# Patient Record
Sex: Female | Born: 1980
Health system: Southern US, Community
[De-identification: ages and names within clinical notes are randomized; demographics above are authoritative.]

## PROBLEM LIST (undated history)

## (undated) DIAGNOSIS — F419 Anxiety disorder, unspecified: Secondary | ICD-10-CM

## (undated) DIAGNOSIS — IMO0002 Reserved for concepts with insufficient information to code with codable children: Secondary | ICD-10-CM

## (undated) DIAGNOSIS — C801 Malignant (primary) neoplasm, unspecified: Secondary | ICD-10-CM

## (undated) DIAGNOSIS — K274 Chronic or unspecified peptic ulcer, site unspecified, with hemorrhage: Secondary | ICD-10-CM

## (undated) DIAGNOSIS — Z5189 Encounter for other specified aftercare: Secondary | ICD-10-CM

## (undated) DIAGNOSIS — T7840XA Allergy, unspecified, initial encounter: Secondary | ICD-10-CM

## (undated) HISTORY — DX: Malignant (primary) neoplasm, unspecified: C80.1

## (undated) HISTORY — DX: Anxiety disorder, unspecified: F41.9

## (undated) HISTORY — DX: Encounter for other specified aftercare: Z51.89

## (undated) HISTORY — PX: MELANOMA EXCISION: SHX5266

## (undated) HISTORY — DX: Reserved for concepts with insufficient information to code with codable children: IMO0002

## (undated) HISTORY — DX: Allergy, unspecified, initial encounter: T78.40XA

## (undated) HISTORY — DX: Chronic or unspecified peptic ulcer, site unspecified, with hemorrhage: K27.4

---

## 2004-12-27 ENCOUNTER — Ambulatory Visit: Payer: Self-pay | Admitting: Internal Medicine

## 2006-04-26 IMAGING — CT CT HEAD WITHOUT CONTRAST
1 series · 16 of 29 positions shown, 20 images · non-contrast
Comparison: none

REASON FOR EXAM: New onset headache x 1 week
COMMENTS:

[Series 2: head 5.0 h40s · axial · 0.36mm/px · z∈[+234,+364]mm · 16 of 29 slices shown, 20 images]
[im 2/29  brain]
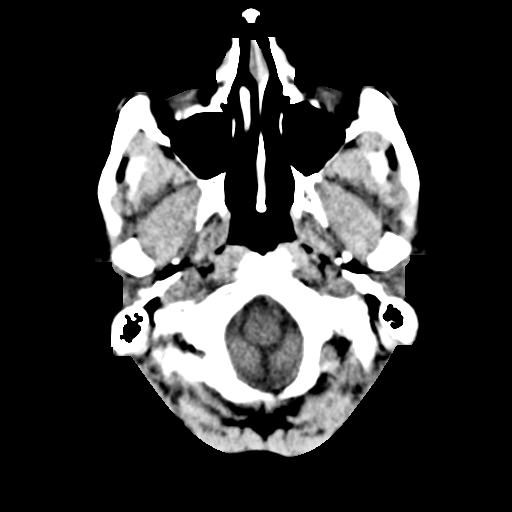
[im 2/29  bone]
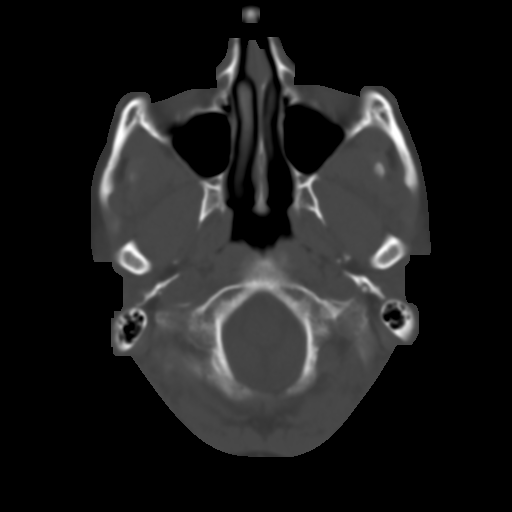
[im 4/29  brain]
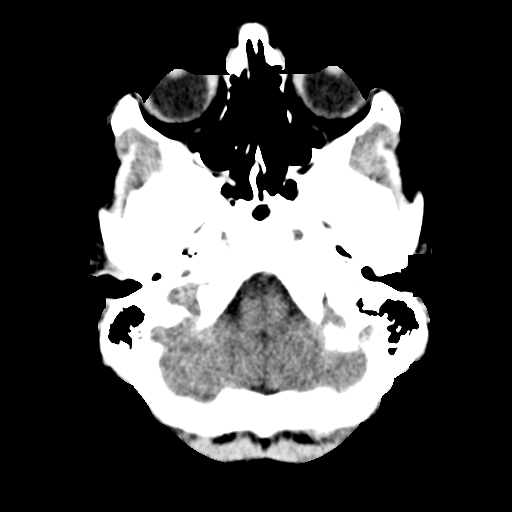
[im 6/29  brain]
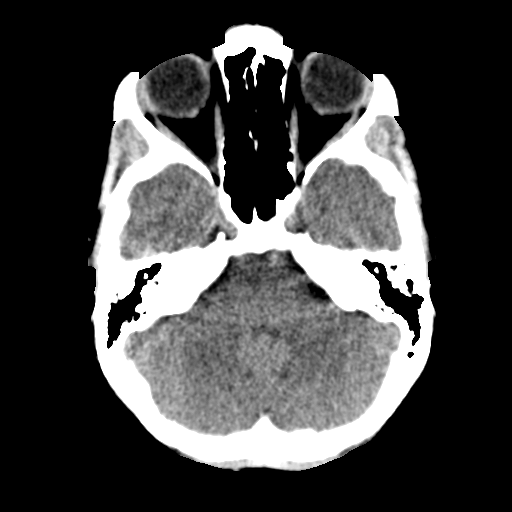
[im 7/29  brain]
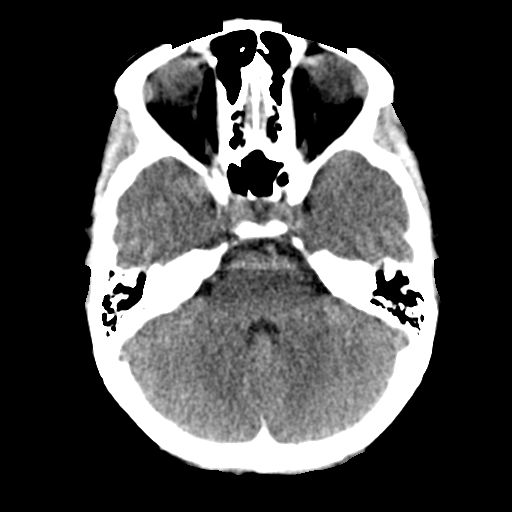
[im 9/29  brain]
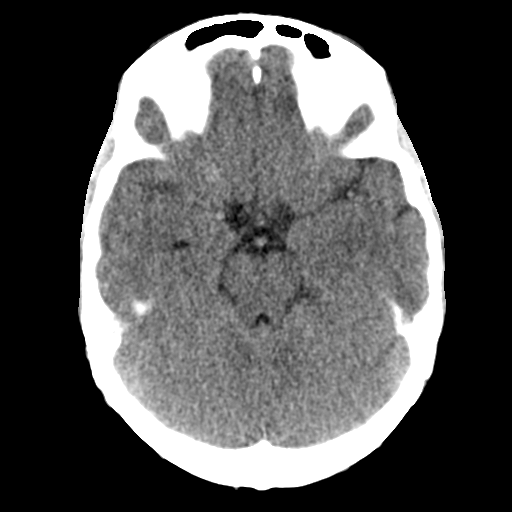
[im 9/29  bone]
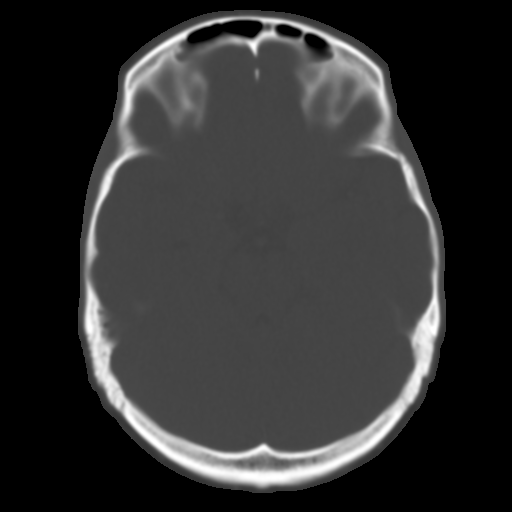
[im 11/29  brain]
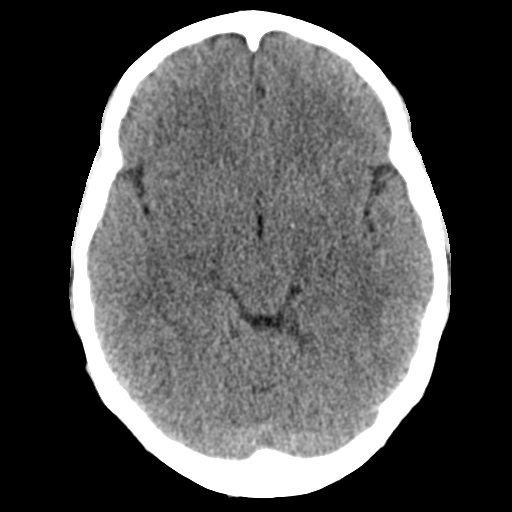
[im 12/29  brain]
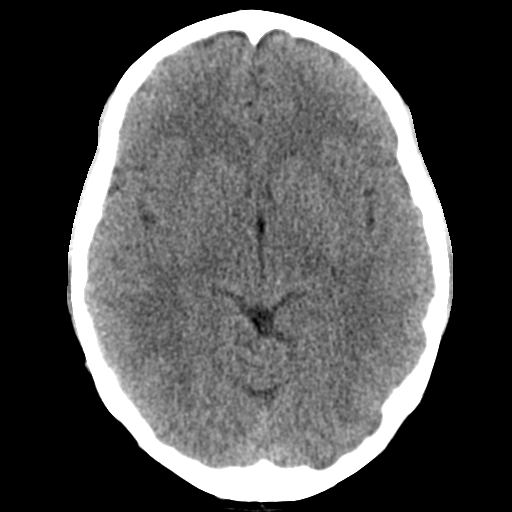
[im 14/29  brain]
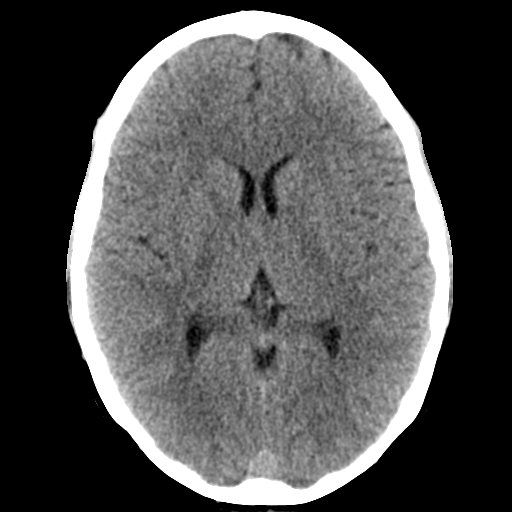
[im 16/29  brain]
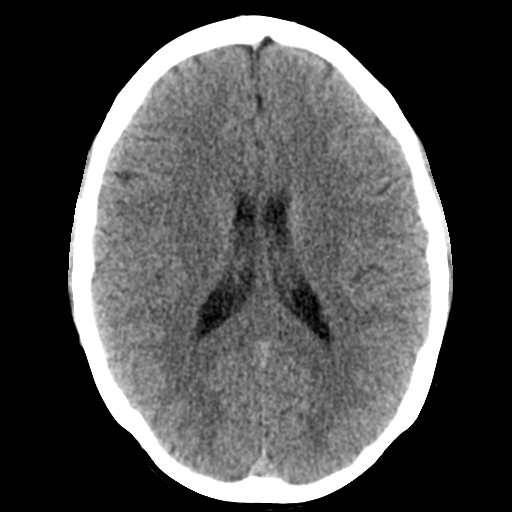
[im 16/29  bone]
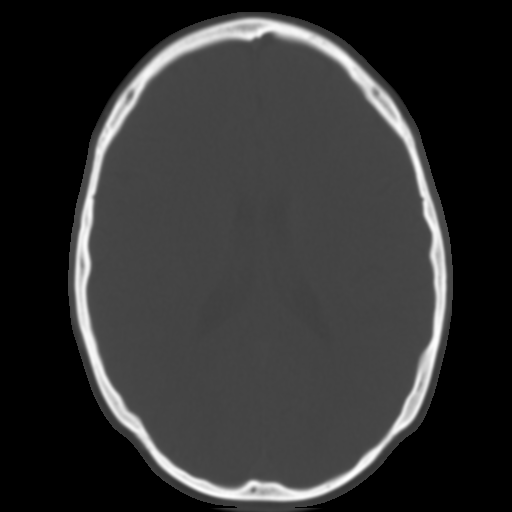
[im 18/29  brain]
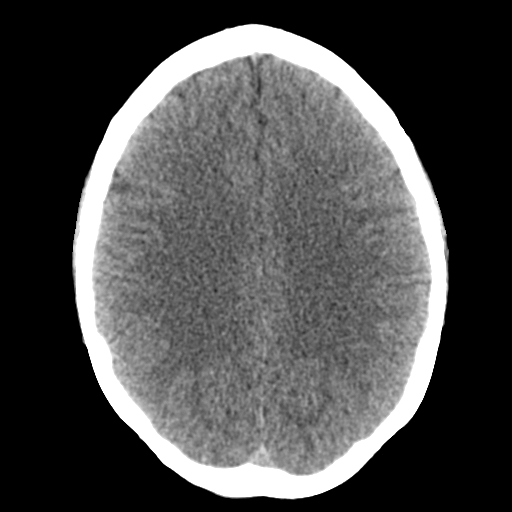
[im 19/29  brain]
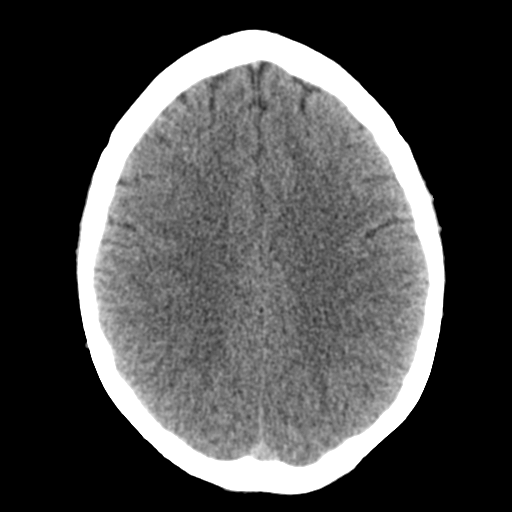
[im 21/29  brain]
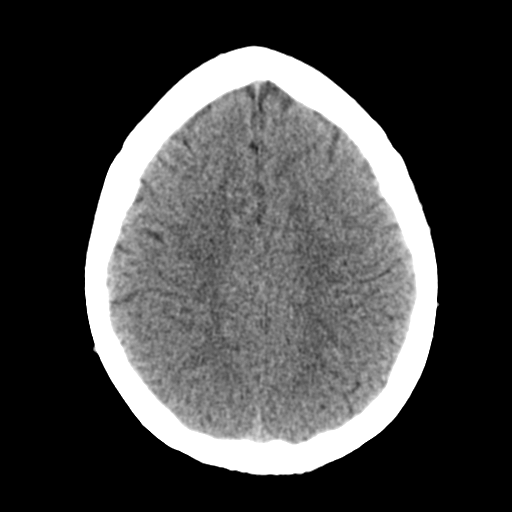
[im 23/29  brain]
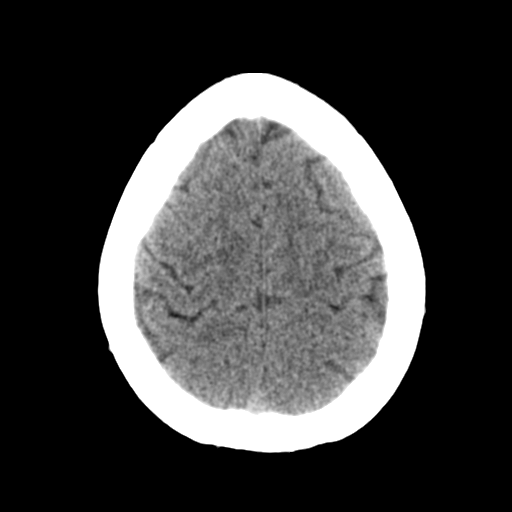
[im 23/29  bone]
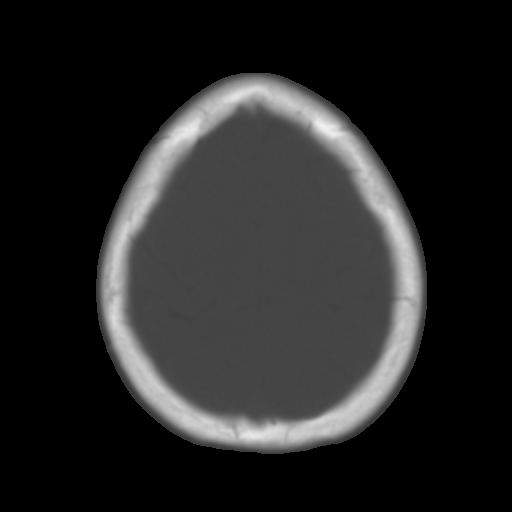
[im 24/29  brain]
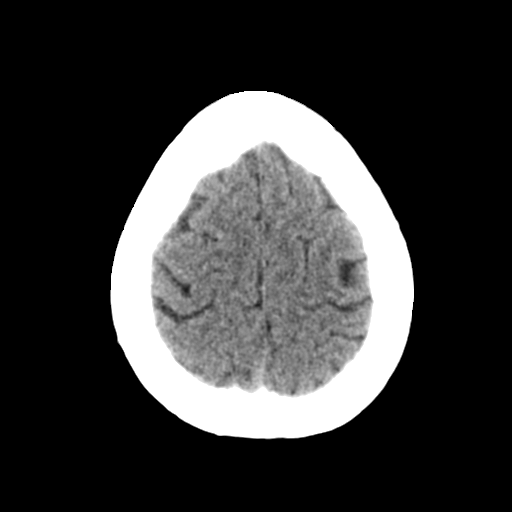
[im 26/29  brain]
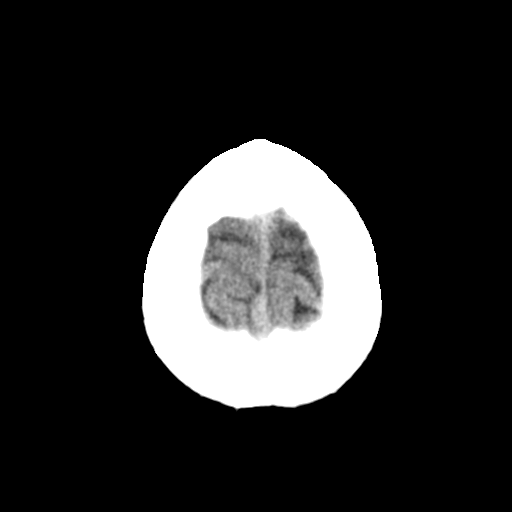
[im 28/29  brain]
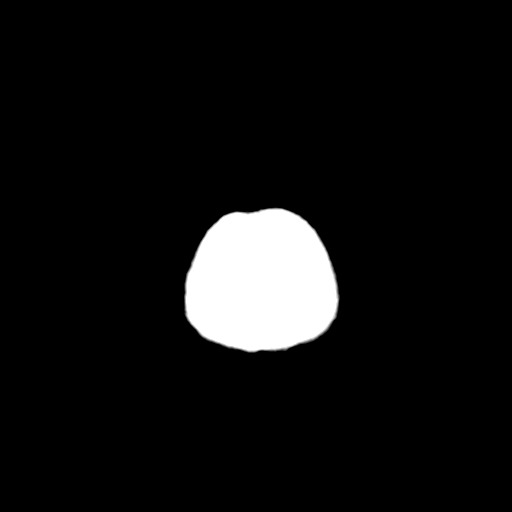

[16 of 29 positions shown; findings below may reference images not displayed]

PROCEDURE:     CT  - CT HEAD WITHOUT CONTRAST  - December 27, 2004  [DATE]

RESULT:     Unenhanced head CT scan was performed for new onset of headaches
which is generalized.

No subarachnoid hemorrhage is seen.  No intraparenchymal bleeds are noted.
No mass effect and no shift of the midline.  Ventricles appear within normal
limits.  No extraaxial fluid collections are noted.  The sinuses appear
clear of the portions that are visualized on the bone window settings.
IMPRESSION: No significant abnormalities noted on the unenhanced head CT.

## 2010-02-13 DIAGNOSIS — K284 Chronic or unspecified gastrojejunal ulcer with hemorrhage: Secondary | ICD-10-CM

## 2010-02-13 DIAGNOSIS — K274 Chronic or unspecified peptic ulcer, site unspecified, with hemorrhage: Secondary | ICD-10-CM

## 2010-02-13 HISTORY — DX: Chronic or unspecified peptic ulcer, site unspecified, with hemorrhage: K27.4

## 2010-02-13 HISTORY — DX: Chronic or unspecified gastrojejunal ulcer with hemorrhage: K28.4

## 2010-03-24 ENCOUNTER — Inpatient Hospital Stay: Payer: Self-pay | Admitting: Internal Medicine

## 2010-03-28 ENCOUNTER — Emergency Department: Payer: Self-pay | Admitting: Emergency Medicine

## 2010-03-29 LAB — PATHOLOGY REPORT

## 2011-12-31 IMAGING — CT CT HEAD WITHOUT CONTRAST
2 series · 16 of 30 positions shown, 20 images · non-contrast
Comparison: none

REASON FOR EXAM: headache
COMMENTS:

PROCEDURE:     CT  - CT HEAD WITHOUT CONTRAST  - March 28, 2010  [DATE]
RESULT:     Comparison:  None
TECHNIQUE: Multiple axial images from the foramen magnum to the vertex were
obtained without IV contrast.

[Series 2: without · axial · non-contrast · 0.37mm/px · z∈[+962,+1082]mm · 13 of 29 slices shown, 17 images]
[im 3/29  brain]
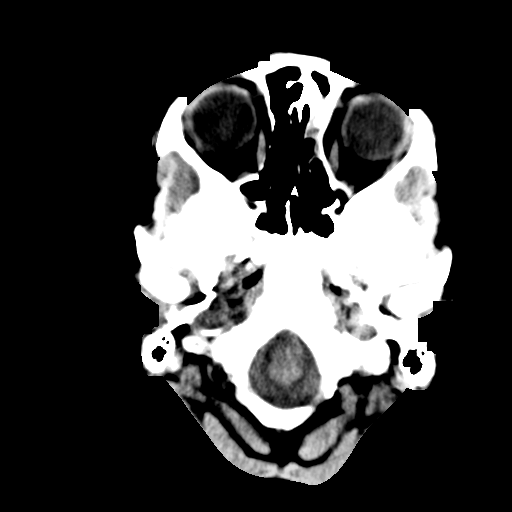
[im 3/29  bone]
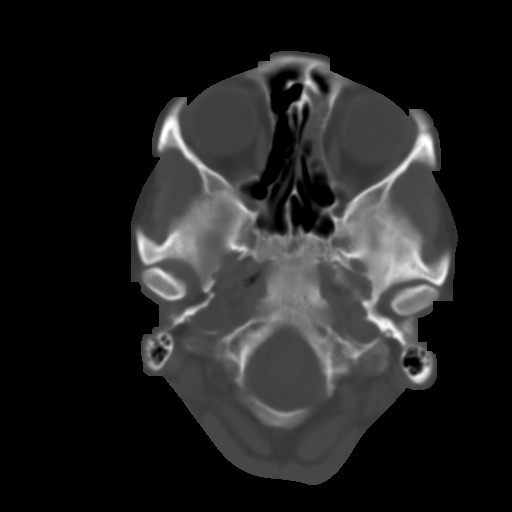
[im 5/29  brain]
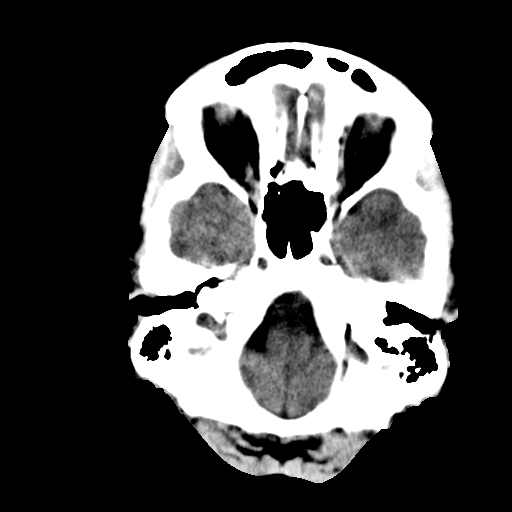
[im 7/29  brain]
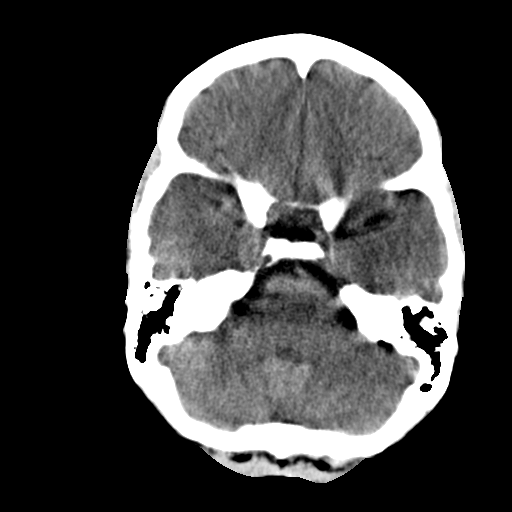
[im 9/29  brain]
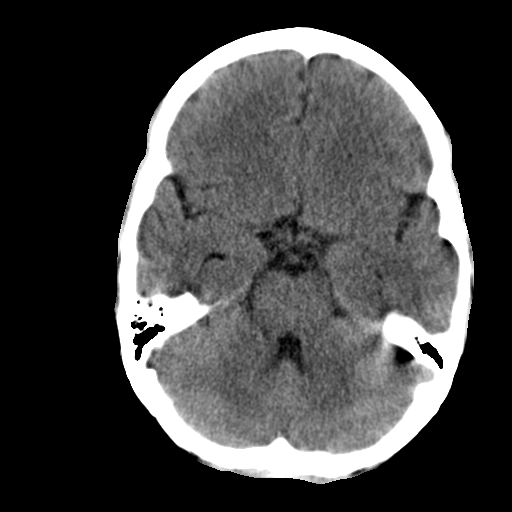
[im 11/29  brain]
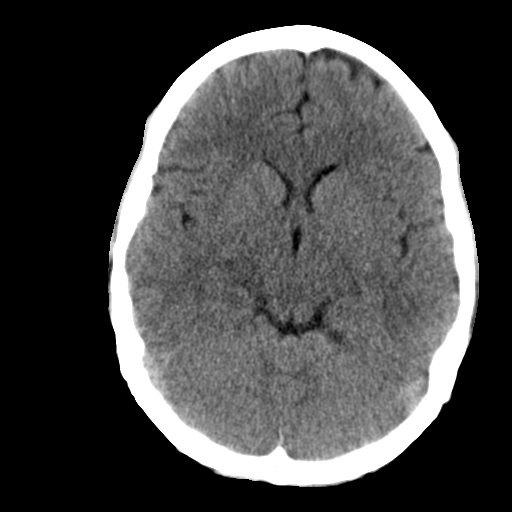
[im 11/29  bone]
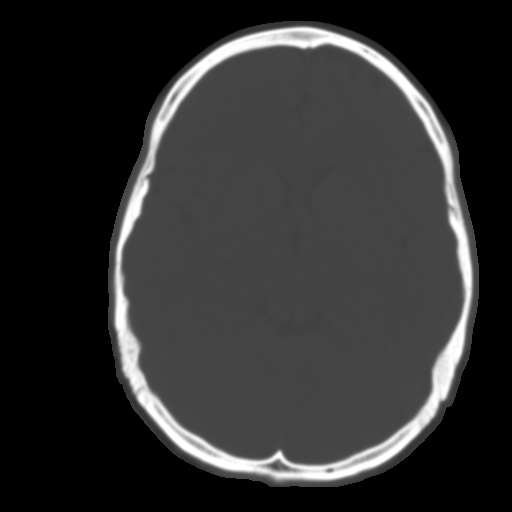
[im 13/29  brain]
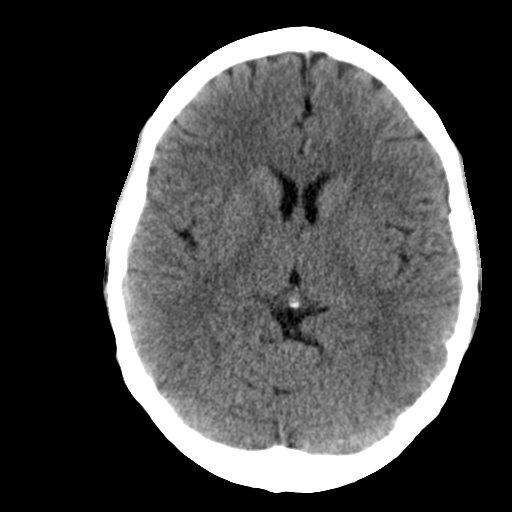
[im 15/29  brain]
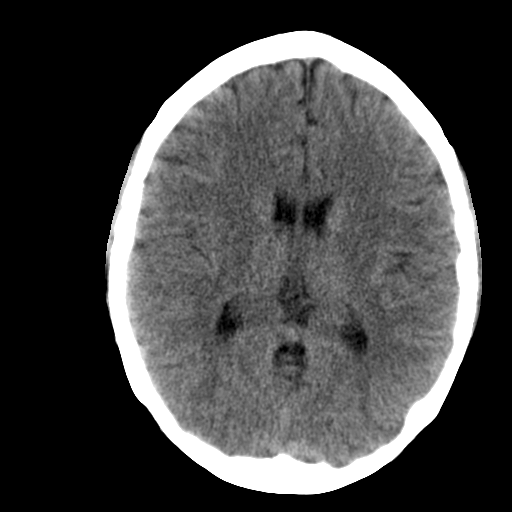
[im 17/29  brain]
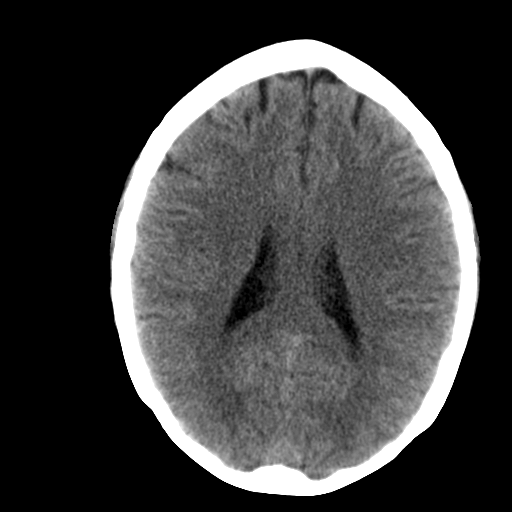
[im 19/29  brain]
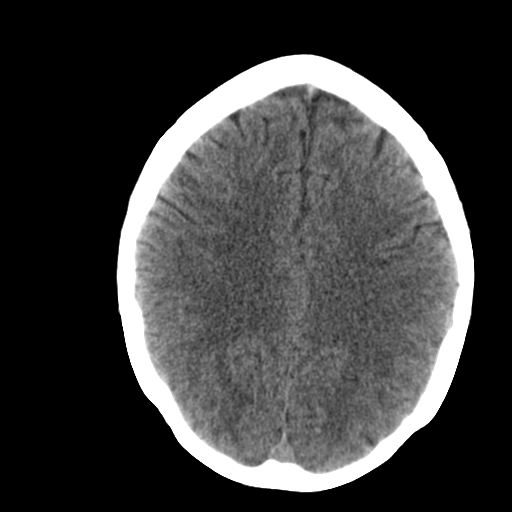
[im 19/29  bone]
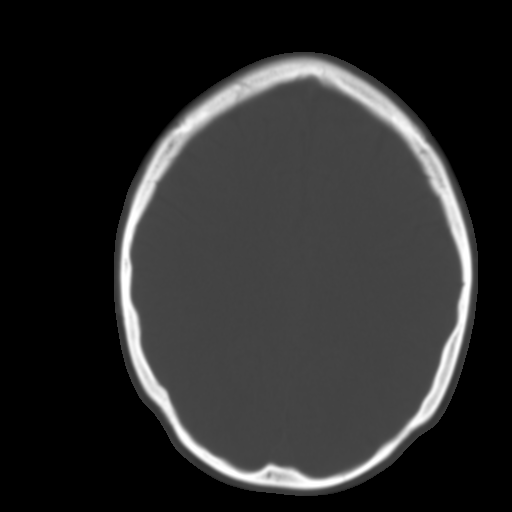
[im 21/29  brain]
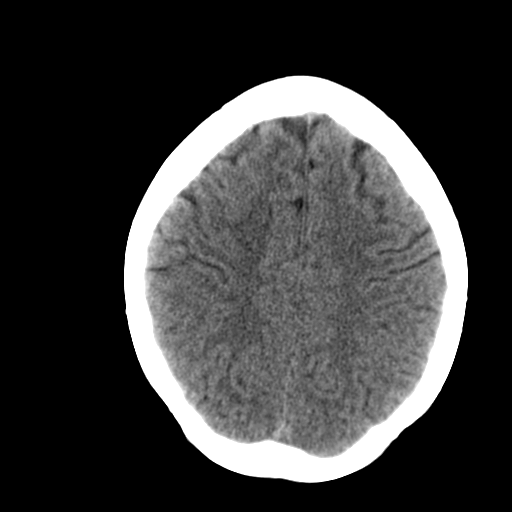
[im 23/29  brain]
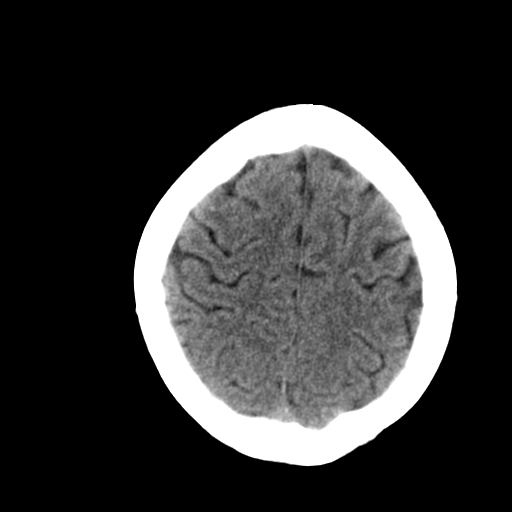
[im 25/29  brain]
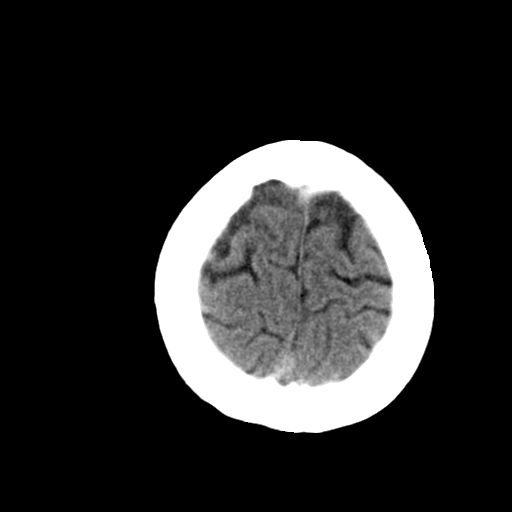
[im 27/29  brain]
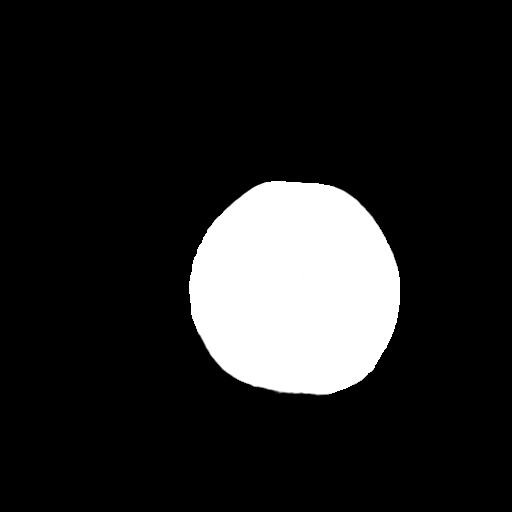
[im 27/29  bone]
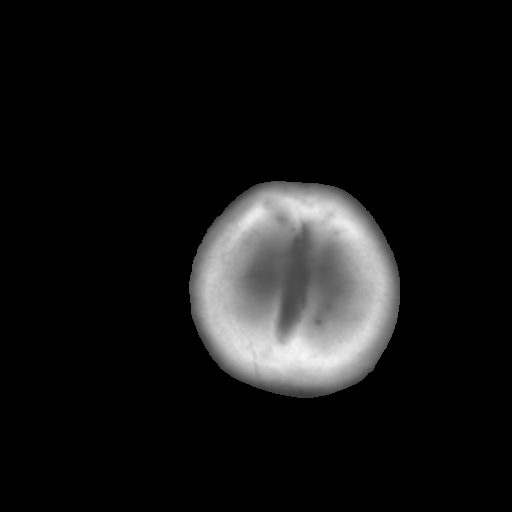

[Series 3: bone · axial · 0.37mm/px · z∈[+962,+1002]mm · 3 of 29 slices shown]
[im 3/29  bone]
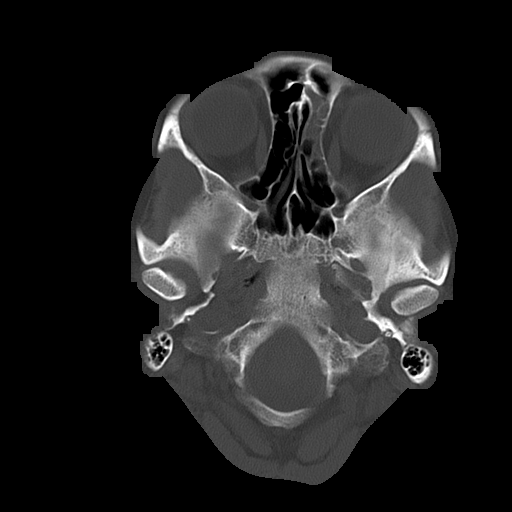
[im 7/29  bone]
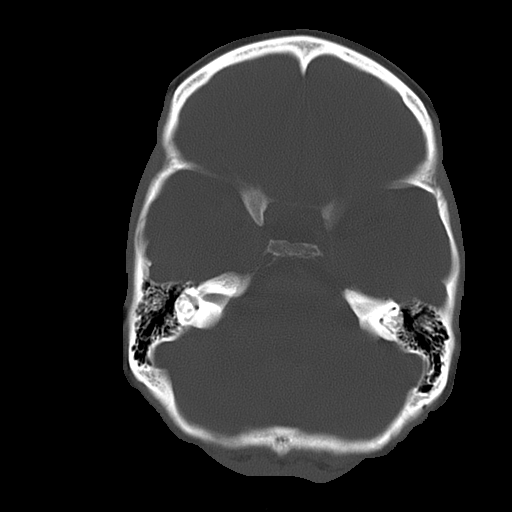
[im 11/29  bone]
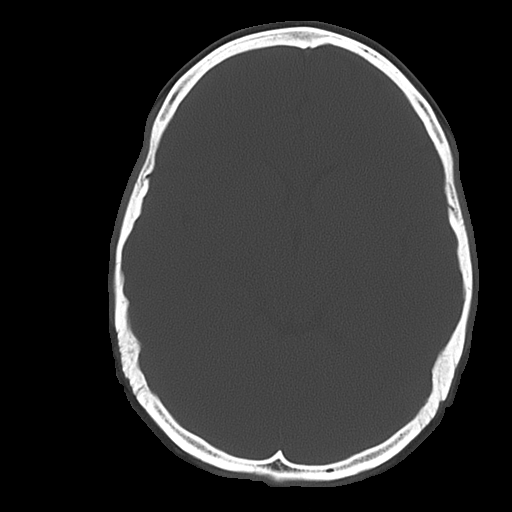

[16 of 30 positions shown; findings below may reference images not displayed]

FINDINGS: There is no evidence of mass effect, midline shift, or extra-axial fluid
collections.  There is no evidence of a space-occupying lesion or
intracranial hemorrhage. There is no evidence of a cortical-based area of
acute infarction.

The ventricles and sulci are appropriate for the patient's age. The basal
cisterns are patent.

Visualized portions of the orbits are unremarkable. There is left ethmoid
sinus mucosal thickening.

The osseous structures are unremarkable.
IMPRESSION: No acute intracranial process.

## 2013-12-26 ENCOUNTER — Observation Stay: Payer: Self-pay | Admitting: Obstetrics and Gynecology

## 2013-12-26 LAB — CBC WITH DIFFERENTIAL/PLATELET
BASOS ABS: 0.1 10*3/uL (ref 0.0–0.1)
Basophil %: 0.8 %
Eosinophil #: 0 10*3/uL (ref 0.0–0.7)
Eosinophil %: 0.4 %
HCT: 43.2 % (ref 35.0–47.0)
HGB: 14.8 g/dL (ref 12.0–16.0)
LYMPHS ABS: 1.5 10*3/uL (ref 1.0–3.6)
Lymphocyte %: 15.3 %
MCH: 32.6 pg (ref 26.0–34.0)
MCHC: 34.4 g/dL (ref 32.0–36.0)
MCV: 95 fL (ref 80–100)
MONOS PCT: 7.6 %
Monocyte #: 0.7 x10 3/mm (ref 0.2–0.9)
NEUTROS PCT: 75.9 %
Neutrophil #: 7.3 10*3/uL — ABNORMAL HIGH (ref 1.4–6.5)
Platelet: 237 10*3/uL (ref 150–440)
RBC: 4.55 10*6/uL (ref 3.80–5.20)
RDW: 13.4 % (ref 11.5–14.5)
WBC: 9.6 10*3/uL (ref 3.6–11.0)

## 2014-01-01 ENCOUNTER — Inpatient Hospital Stay: Payer: Self-pay | Admitting: Obstetrics & Gynecology

## 2014-01-01 LAB — CBC WITH DIFFERENTIAL/PLATELET
BASOS PCT: 0.6 %
Basophil #: 0.1 10*3/uL (ref 0.0–0.1)
EOS ABS: 0 10*3/uL (ref 0.0–0.7)
Eosinophil %: 0.2 %
HCT: 39.3 % (ref 35.0–47.0)
HGB: 13.4 g/dL (ref 12.0–16.0)
Lymphocyte #: 1.1 10*3/uL (ref 1.0–3.6)
Lymphocyte %: 11.4 %
MCH: 32.3 pg (ref 26.0–34.0)
MCHC: 34.1 g/dL (ref 32.0–36.0)
MCV: 95 fL (ref 80–100)
MONOS PCT: 8.1 %
Monocyte #: 0.8 x10 3/mm (ref 0.2–0.9)
NEUTROS PCT: 79.7 %
Neutrophil #: 7.5 10*3/uL — ABNORMAL HIGH (ref 1.4–6.5)
Platelet: 218 10*3/uL (ref 150–440)
RBC: 4.16 10*6/uL (ref 3.80–5.20)
RDW: 13.2 % (ref 11.5–14.5)
WBC: 9.5 10*3/uL (ref 3.6–11.0)

## 2014-01-03 LAB — HEMATOCRIT: HCT: 32 % — AB (ref 35.0–47.0)

## 2014-06-23 NOTE — H&P (Signed)
L&D Evaluation:  History Expanded:  HPI 34yo G2P0 at 23w1day who presents for IOL due to postdates and oligo at 5.7. she is Opos/RI/VI/ GBS pos who has not gone into spontaneous labor. she desires as natural a delivery as possible but understands that she is in a high risk category that requires active management of labor topromote delivery. She got her tdap on 8/28 she has a douls for labor per her notes, she failed her one hour but passed her 3 hour ogtt.   Gravida 2   Term 0   PreTerm 0   Abortion 1   Living 0   Blood Type (Maternal) O positive   Group B Strep Results Maternal (Result >5wks must be treated as unknown) positive   Maternal HIV Negative   Maternal Syphilis Ab Nonreactive   Maternal Varicella Immune   Rubella Results (Maternal) immune   Maternal T-Dap Immune   EDC 18-Dec-2013   Presents with oligo and post dates   Patient's Medical History No Chronic Illness   Patient's Surgical History none   Medications Pre Natal Vitamins   Allergies other, NSAIDS   Social History none   Family History Non-Contributory   ROS:  ROS All systems were reviewed.  HEENT, CNS, GI, GU, Respiratory, CV, Renal and Musculoskeletal systems were found to be normal.   Exam:  Vital Signs stable   Urine Protein not completed   General no apparent distress   Mental Status clear   Chest clear   Heart normal sinus rhythm   Abdomen gravid, non-tender   Estimated Fetal Weight Average for gestational age   Fetal Position vertex   Fundal Height term   Back no CVAT   Edema no edema   Reflexes 1+   Pelvic no external lesions, 1/80/-2   Mebranes Intact   FHT normal rate with no decels   Ucx absent   Skin dry   Lymph no lymphadenopathy   Impression:  Impression oligo postdates   Plan:  Plan EFM/NST, antibiotics for GBBS prophylaxis   Comments nipple stim and foley bulb and allow as natural as possible.pt aware the plan may change intermittently    Electronic Signatures: Erik Obey (MD)  (Signed 13-Nov-15 17:50)  Authored: L&D Evaluation   Last Updated: 13-Nov-15 17:50 by Erik Obey (MD)

## 2016-01-03 ENCOUNTER — Encounter: Payer: Self-pay | Admitting: Family Medicine

## 2016-01-03 ENCOUNTER — Ambulatory Visit (INDEPENDENT_AMBULATORY_CARE_PROVIDER_SITE_OTHER): Payer: PRIVATE HEALTH INSURANCE | Admitting: Family Medicine

## 2016-01-03 VITALS — BP 104/71 | HR 85 | Temp 98.3°F | Ht 65.1 in | Wt 115.0 lb

## 2016-01-03 DIAGNOSIS — Z8711 Personal history of peptic ulcer disease: Secondary | ICD-10-CM | POA: Insufficient documentation

## 2016-01-03 DIAGNOSIS — Z8719 Personal history of other diseases of the digestive system: Secondary | ICD-10-CM

## 2016-01-03 NOTE — Progress Notes (Signed)
BP 104/71 (BP Location: Left Arm, Patient Position: Sitting, Cuff Size: Normal)   Pulse 85   Temp 98.3 F (36.8 C)   Ht 5' 5.1" (1.654 m)   Wt 115 lb (52.2 kg)   LMP 12/11/2015 (Exact Date)   SpO2 100%   BMI 19.08 kg/m    Subjective:    Patient ID: Debbie Green, female    DOB: 23-Jan-1981, 35 y.o.   MRN: [id number]  HPI: Debbie Green is a 35 y.o. female  Chief Complaint  Patient presents with  . Establish Care   Had some bleeding ulcers in 2012 after taking antibiotics. Ended up needed transfusion.  A couple of months ago felt like she was getting worse again. Had been taking advil again due to headaches. Started feeling like she was nauseous and feeling like she was sick again. Was tested for H. Pylori in the past. She was negative. More of a nagging pain. Drank a ton of cabbage juice and has been feeling better since then. Has been doing healing touch therapy and has been feeling well. Has been under a lot of stress with her job and the healing touch has been helping with this.   Active Ambulatory Problems    Diagnosis Date Noted  . History of gastric ulcer 01/03/2016   Resolved Ambulatory Problems    Diagnosis Date Noted  . No Resolved Ambulatory Problems   Past Medical History:  Diagnosis Date  . Bleeding ulcer 2012   History reviewed. No pertinent surgical history. No medication comments found. No Known Allergies Social History   Social History  . Marital status: Married    Spouse name: N/A  . Number of children: N/A  . Years of education: N/A   Social History Main Topics  . Smoking status: Never Smoker  . Smokeless tobacco: Never Used  . Alcohol use 4.2 oz/week    7 Glasses of wine per week  . Drug use: No  . Sexual activity: Yes    Birth control/ protection: IUD   Other Topics Concern  . None   Social History Narrative  . None   Family History  Problem Relation Age of Onset  . Anemia Mother   . Heart disease Maternal Grandmother    . Aneurysm Paternal Grandmother     Brain  . COPD Paternal Grandfather    Review of Systems  Constitutional: Negative.   Respiratory: Negative.   Cardiovascular: Negative.   Gastrointestinal: Positive for abdominal pain. Negative for abdominal distention, anal bleeding, blood in stool, constipation, diarrhea, nausea, rectal pain and vomiting.  Psychiatric/Behavioral: Negative.     Per HPI unless specifically indicated above     Objective:    BP 104/71 (BP Location: Left Arm, Patient Position: Sitting, Cuff Size: Normal)   Pulse 85   Temp 98.3 F (36.8 C)   Ht 5' 5.1" (1.654 m)   Wt 115 lb (52.2 kg)   LMP 12/11/2015 (Exact Date)   SpO2 100%   BMI 19.08 kg/m   Wt Readings from Last 3 Encounters:  01/03/16 115 lb (52.2 kg)    Physical Exam  Constitutional: She is oriented to person, place, and time. She appears well-developed and well-nourished. No distress.  HENT:  Head: Normocephalic and atraumatic.  Right Ear: Hearing normal.  Left Ear: Hearing normal.  Nose: Nose normal.  Eyes: Conjunctivae and lids are normal. Right eye exhibits no discharge. Left eye exhibits no discharge. No scleral icterus.  Cardiovascular: Normal rate, regular rhythm, normal heart  sounds and intact distal pulses.  Exam reveals no gallop and no friction rub.   No murmur heard. Pulmonary/Chest: Effort normal and breath sounds normal. No respiratory distress. She has no wheezes. She has no rales. She exhibits no tenderness.  Abdominal: Soft. Bowel sounds are normal. She exhibits no distension and no mass. There is no tenderness. There is no rebound and no guarding.  Musculoskeletal: Normal range of motion.  Neurological: She is alert and oriented to person, place, and time.  Skin: Skin is intact. No rash noted.  Psychiatric: She has a normal mood and affect. Her speech is normal and behavior is normal. Judgment and thought content normal. Cognition and memory are normal.  Nursing note and vitals  reviewed.      Assessment & Plan:   Problem List Items Addressed This Visit      Other   History of gastric ulcer - Primary    No big symptoms today, but had some pain about a month ago. Will check labs. Call with any pain. Follow up after new year for physical.      Relevant Orders   CBC with Differential/Platelet   Comprehensive metabolic panel   Lipid Panel w/o Chol/HDL Ratio   TSH   H. pylori antibody, IgG       Follow up plan: Return After the new year, for Physical.

## 2016-01-03 NOTE — Assessment & Plan Note (Signed)
No big symptoms today, but had some pain about a month ago. Will check labs. Call with any pain. Follow up after new year for physical.

## 2016-01-04 LAB — COMPREHENSIVE METABOLIC PANEL
ALT: 10 IU/L (ref 0–32)
AST: 17 IU/L (ref 0–40)
Albumin/Globulin Ratio: 2 (ref 1.2–2.2)
Albumin: 4.8 g/dL (ref 3.5–5.5)
Alkaline Phosphatase: 74 IU/L (ref 39–117)
BILIRUBIN TOTAL: 0.6 mg/dL (ref 0.0–1.2)
BUN/Creatinine Ratio: 19 (ref 9–23)
BUN: 15 mg/dL (ref 6–20)
CHLORIDE: 100 mmol/L (ref 96–106)
CO2: 24 mmol/L (ref 18–29)
Calcium: 9.4 mg/dL (ref 8.7–10.2)
Creatinine, Ser: 0.8 mg/dL (ref 0.57–1.00)
GFR calc non Af Amer: 96 mL/min/{1.73_m2} (ref >59)
GFR, EST AFRICAN AMERICAN: 111 mL/min/{1.73_m2} (ref >59)
GLUCOSE: 85 mg/dL (ref 65–99)
Globulin, Total: 2.4 g/dL (ref 1.5–4.5)
Potassium: 4 mmol/L (ref 3.5–5.2)
Sodium: 139 mmol/L (ref 134–144)
TOTAL PROTEIN: 7.2 g/dL (ref 6.0–8.5)

## 2016-01-04 LAB — CBC WITH DIFFERENTIAL/PLATELET
Basophils Absolute: 0.1 10*3/uL (ref 0.0–0.2)
Basos: 1 %
EOS (ABSOLUTE): 0.1 10*3/uL (ref 0.0–0.4)
Eos: 1 %
Hematocrit: 41.7 % (ref 34.0–46.6)
Hemoglobin: 13.9 g/dL (ref 11.1–15.9)
IMMATURE GRANS (ABS): 0 10*3/uL (ref 0.0–0.1)
IMMATURE GRANULOCYTES: 0 %
LYMPHS: 33 %
Lymphocytes Absolute: 1.9 10*3/uL (ref 0.7–3.1)
MCH: 29.4 pg (ref 26.6–33.0)
MCHC: 33.3 g/dL (ref 31.5–35.7)
MCV: 88 fL (ref 79–97)
Monocytes Absolute: 0.5 10*3/uL (ref 0.1–0.9)
Monocytes: 8 %
Neutrophils Absolute: 3.2 10*3/uL (ref 1.4–7.0)
Neutrophils: 57 %
Platelets: 282 10*3/uL (ref 150–379)
RBC: 4.73 x10E6/uL (ref 3.77–5.28)
RDW: 13.1 % (ref 12.3–15.4)
WBC: 5.7 10*3/uL (ref 3.4–10.8)

## 2016-01-04 LAB — LIPID PANEL W/O CHOL/HDL RATIO
Cholesterol, Total: 198 mg/dL (ref 100–199)
HDL: 84 mg/dL (ref >39)
LDL Calculated: 105 mg/dL — ABNORMAL HIGH (ref 0–99)
TRIGLYCERIDES: 45 mg/dL (ref 0–149)
VLDL CHOLESTEROL CAL: 9 mg/dL (ref 5–40)

## 2016-01-04 LAB — H. PYLORI ANTIBODY, IGG: H Pylori IgG: <0.9 U/mL (ref 0.0–0.8)

## 2016-01-04 LAB — TSH: TSH: 0.833 u[IU]/mL (ref 0.450–4.500)

## 2016-02-22 ENCOUNTER — Ambulatory Visit (INDEPENDENT_AMBULATORY_CARE_PROVIDER_SITE_OTHER): Payer: PRIVATE HEALTH INSURANCE | Admitting: Family Medicine

## 2016-02-22 ENCOUNTER — Encounter: Payer: Self-pay | Admitting: Family Medicine

## 2016-02-22 VITALS — BP 124/81 | HR 84 | Temp 98.2°F | Ht 65.0 in | Wt 115.8 lb

## 2016-02-22 DIAGNOSIS — Z0000 Encounter for general adult medical examination without abnormal findings: Secondary | ICD-10-CM

## 2016-02-22 DIAGNOSIS — Z23 Encounter for immunization: Secondary | ICD-10-CM

## 2016-02-22 LAB — UA/M W/RFLX CULTURE, ROUTINE
BILIRUBIN UA: NEGATIVE
Glucose, UA: NEGATIVE
Ketones, UA: NEGATIVE
Leukocytes, UA: NEGATIVE
NITRITE UA: NEGATIVE
PH UA: 7.5 (ref 5.0–7.5)
Protein, UA: NEGATIVE
RBC UA: NEGATIVE
SPEC GRAV UA: 1.01 (ref 1.005–1.030)
Urobilinogen, Ur: 0.2 mg/dL (ref 0.2–1.0)

## 2016-02-22 NOTE — Progress Notes (Signed)
BP 124/81 (BP Location: Left Arm, Patient Position: Sitting, Cuff Size: Normal)   Pulse 84   Temp 98.2 F (36.8 C)   Ht 5\' 5"  (1.651 m)   Wt 115 lb 12.8 oz (52.5 kg)   LMP 01/23/2016 (Approximate)   SpO2 100%   BMI 19.27 kg/m    Subjective:    Patient ID: Debbie Green, female    DOB: 1980/05/21, 36 y.o.   MRN: [id number]  HPI: FLO PIGNOTTI is a 36 y.o. female presenting on 02/22/2016 for comprehensive medical examination. Current medical complaints include: Has been under a lot more stress since last time. Has been doing healing touch.   She currently lives with: husband and kid Menopausal Symptoms: no  Depression Screen done today and results listed below:  Depression screen PHQ 2/9 02/22/2016  Decreased Interest 0  Down, Depressed, Hopeless 0  PHQ - 2 Score 0    Past Medical History:  Past Medical History:  Diagnosis Date  . Bleeding ulcer 2012   Surgical History:  History reviewed. No pertinent surgical history.  Medications:  Current Outpatient Prescriptions on File Prior to Visit  Medication Sig  . HESPERIDIN-DIOSMIN PO Take by mouth.   No current facility-administered medications on file prior to visit.     Allergies:  No Known Allergies  Social History:  Social History   Social History  . Marital status: Married    Spouse name: N/A  . Number of children: N/A  . Years of education: N/A   Occupational History  . Not on file.   Social History Main Topics  . Smoking status: Never Smoker  . Smokeless tobacco: Never Used  . Alcohol use 4.2 oz/week    7 Glasses of wine per week  . Drug use: No  . Sexual activity: Yes    Birth control/ protection: IUD   Other Topics Concern  . Not on file   Social History Narrative  . No narrative on file   History  Smoking Status  . Never Smoker  Smokeless Tobacco  . Never Used   History  Alcohol Use  . 4.2 oz/week  . 7 Glasses of wine per week    Family History:  Family History    Problem Relation Age of Onset  . Anemia Mother   . Heart disease Maternal Grandmother   . Aneurysm Paternal Grandmother     Brain  . COPD Paternal Grandfather    Past medical history, surgical history, medications, allergies, family history and social history reviewed with patient today and changes made to appropriate areas of the chart.   Review of Systems  Constitutional: Negative.   HENT: Negative.   Eyes: Negative.   Respiratory: Negative.   Cardiovascular: Negative.   Gastrointestinal: Positive for abdominal pain and heartburn. Negative for blood in stool, constipation, diarrhea, melena, nausea and vomiting.  Genitourinary: Negative.   Musculoskeletal: Positive for back pain. Negative for falls, joint pain, myalgias and neck pain.       Breast tenderness   Skin: Positive for rash. Negative for itching.  Neurological: Negative.   Endo/Heme/Allergies: Negative for environmental allergies and polydipsia. Bruises/bleeds easily.  Psychiatric/Behavioral: Negative for depression, hallucinations, memory loss, substance abuse and suicidal ideas. The patient is nervous/anxious. The patient does not have insomnia.     All other ROS negative except what is listed above and in the HPI.      Objective:    BP 124/81 (BP Location: Left Arm, Patient Position: Sitting, Cuff Size: Normal)  Pulse 84   Temp 98.2 F (36.8 C)   Ht 5\' 5"  (1.651 m)   Wt 115 lb 12.8 oz (52.5 kg)   LMP 01/23/2016 (Approximate)   SpO2 100%   BMI 19.27 kg/m   Wt Readings from Last 3 Encounters:  02/22/16 115 lb 12.8 oz (52.5 kg)  01/03/16 115 lb (52.2 kg)    Physical Exam  Constitutional: She is oriented to person, place, and time. She appears well-developed and well-nourished. No distress.  HENT:  Head: Normocephalic and atraumatic.  Right Ear: Hearing, tympanic membrane, external ear and ear canal normal.  Left Ear: Hearing, tympanic membrane, external ear and ear canal normal.  Nose: Nose normal.   Mouth/Throat: Uvula is midline, oropharynx is clear and moist and mucous membranes are normal. No oropharyngeal exudate.  Eyes: Conjunctivae, EOM and lids are normal. Right eye exhibits no discharge. Left eye exhibits no discharge. No scleral icterus.  Neck: Normal range of motion. Neck supple. No JVD present. No tracheal deviation present. No thyromegaly present.  Cardiovascular: Normal rate, regular rhythm, normal heart sounds and intact distal pulses.  Exam reveals no gallop and no friction rub.   No murmur heard. Pulmonary/Chest: Effort normal and breath sounds normal. No stridor. No respiratory distress. She has no wheezes. She has no rales. She exhibits no tenderness.  Abdominal: Soft. Bowel sounds are normal. She exhibits no distension and no mass. There is no tenderness. There is no rebound and no guarding.  Genitourinary:  Genitourinary Comments: Breast and Pelvic deferred- done at GYN  Musculoskeletal: Normal range of motion. She exhibits no edema, tenderness or deformity.  Lymphadenopathy:    She has no cervical adenopathy.  Neurological: She is alert and oriented to person, place, and time. She has normal reflexes. She displays normal reflexes. No cranial nerve deficit. She exhibits normal muscle tone. Coordination normal.  Skin: Skin is warm, dry and intact. No rash noted. She is not diaphoretic. No erythema. No pallor.  Psychiatric: She has a normal mood and affect. Her speech is normal and behavior is normal. Judgment and thought content normal. Cognition and memory are normal.  Nursing note and vitals reviewed.   Results for orders placed or performed in visit on 01/03/16  CBC with Differential/Platelet  Result Value Ref Range   WBC 5.7 3.4 - 10.8 x10E3/uL   RBC 4.73 3.77 - 5.28 x10E6/uL   Hemoglobin 13.9 11.1 - 15.9 g/dL   Hematocrit 41.7 34.0 - 46.6 %   MCV 88 79 - 97 fL   MCH 29.4 26.6 - 33.0 pg   MCHC 33.3 31.5 - 35.7 g/dL   RDW 13.1 12.3 - 15.4 %   Platelets 282  150 - 379 x10E3/uL   Neutrophils 57 Not Estab. %   Lymphs 33 Not Estab. %   Monocytes 8 Not Estab. %   Eos 1 Not Estab. %   Basos 1 Not Estab. %   Neutrophils Absolute 3.2 1.4 - 7.0 x10E3/uL   Lymphocytes Absolute 1.9 0.7 - 3.1 x10E3/uL   Monocytes Absolute 0.5 0.1 - 0.9 x10E3/uL   EOS (ABSOLUTE) 0.1 0.0 - 0.4 x10E3/uL   Basophils Absolute 0.1 0.0 - 0.2 x10E3/uL   Immature Granulocytes 0 Not Estab. %   Immature Grans (Abs) 0.0 0.0 - 0.1 x10E3/uL  Comprehensive metabolic panel  Result Value Ref Range   Glucose 85 65 - 99 mg/dL   BUN 15 6 - 20 mg/dL   Creatinine, Ser 0.80 0.57 - 1.00 mg/dL   GFR calc  non Af Amer 96 >59 mL/min/1.73   GFR calc Af Amer 111 >59 mL/min/1.73   BUN/Creatinine Ratio 19 9 - 23   Sodium 139 134 - 144 mmol/L   Potassium 4.0 3.5 - 5.2 mmol/L   Chloride 100 96 - 106 mmol/L   CO2 24 18 - 29 mmol/L   Calcium 9.4 8.7 - 10.2 mg/dL   Total Protein 7.2 6.0 - 8.5 g/dL   Albumin 4.8 3.5 - 5.5 g/dL   Globulin, Total 2.4 1.5 - 4.5 g/dL   Albumin/Globulin Ratio 2.0 1.2 - 2.2   Bilirubin Total 0.6 0.0 - 1.2 mg/dL   Alkaline Phosphatase 74 39 - 117 IU/L   AST 17 0 - 40 IU/L   ALT 10 0 - 32 IU/L  Lipid Panel w/o Chol/HDL Ratio  Result Value Ref Range   Cholesterol, Total 198 100 - 199 mg/dL   Triglycerides 45 0 - 149 mg/dL   HDL 84 >39 mg/dL   VLDL Cholesterol Cal 9 5 - 40 mg/dL   LDL Calculated 105 (H) 0 - 99 mg/dL  TSH  Result Value Ref Range   TSH 0.833 0.450 - 4.500 uIU/mL  H. pylori antibody, IgG  Result Value Ref Range   H Pylori IgG <0.9 0.0 - 0.8 U/mL      Assessment & Plan:   Problem List Items Addressed This Visit    None    Visit Diagnoses    Routine general medical examination at a health care facility    -  Primary   Up to date on vaccines. Screening labs checked last visit. Pap to be done at GYN. Continue diet and exercise.    Relevant Orders   UA/M w/rflx Culture, Routine   Immunization due       Flu shot given today.   Relevant Orders    Flu Vaccine QUAD 36+ mos PF IM (Fluarix & Fluzone Quad PF) (Completed)       Follow up plan: Return in about 1 year (around 02/21/2017) for Physical.   LABORATORY TESTING:  - Pap smear: done elsewhere  - Tdap: Tetanus vaccination status reviewed: last tetanus booster within 10 years. - Influenza: Administered today - Pneumovax: Not applicable  PATIENT COUNSELING:   Advised to take 1 mg of folate supplement per day if capable of pregnancy.   Sexuality: Discussed sexually transmitted diseases, partner selection, use of condoms, avoidance of unintended pregnancy  and contraceptive alternatives.   Advised to avoid cigarette smoking.  I discussed with the patient that most people either abstain from alcohol or drink within safe limits (<=14/week and <=4 drinks/occasion for males, <=7/weeks and <= 3 drinks/occasion for females) and that the risk for alcohol disorders and other health effects rises proportionally with the number of drinks per week and how often a drinker exceeds daily limits.  Discussed cessation/primary prevention of drug use and availability of treatment for abuse.   Diet: Encouraged to adjust caloric intake to maintain  or achieve ideal body weight, to reduce intake of dietary saturated fat and total fat, to limit sodium intake by avoiding high sodium foods and not adding table salt, and to maintain adequate dietary potassium and calcium preferably from fresh fruits, vegetables, and low-fat dairy products.    stressed the importance of regular exercise  Injury prevention: Discussed safety belts, safety helmets, smoke detector, smoking near bedding or upholstery.   Dental health: Discussed importance of regular tooth brushing, flossing, and dental visits.    NEXT PREVENTATIVE PHYSICAL DUE IN  1 YEAR. Return in about 1 year (around 02/21/2017) for Physical.

## 2016-02-22 NOTE — Patient Instructions (Addendum)
Influenza (Flu) Vaccine (Inactivated or Recombinant): What You Need to Know 1. Why get vaccinated? Influenza ("flu") is a contagious disease that spreads around the [place] every year, usually between October and May. Flu is caused by influenza viruses, and is spread mainly by coughing, sneezing, and close contact. Anyone can get flu. Flu strikes suddenly and can last several days. Symptoms vary by age, but can include:  fever/chills  sore throat  muscle aches  fatigue  cough  headache  runny or stuffy nose Flu can also lead to pneumonia and blood infections, and cause diarrhea and seizures in children. If you have a medical condition, such as heart or lung disease, flu can make it worse. Flu is more dangerous for some people. Infants and young children, people 65 years of age and older, pregnant women, and people with certain health conditions or a weakened immune system are at greatest risk. Each year thousands of people in the [place] die from flu, and many more are hospitalized. Flu vaccine can:  keep you from getting flu,  make flu less severe if you do get it, and  keep you from spreading flu to your family and other people. 2. Inactivated and recombinant flu vaccines A dose of flu vaccine is recommended every flu season. Children 6 months through 8 years of age may need two doses during the same flu season. Everyone else needs only one dose each flu season. Some inactivated flu vaccines contain a very small amount of a mercury-based preservative called thimerosal. Studies have not shown thimerosal in vaccines to be harmful, but flu vaccines that do not contain thimerosal are available. There is no live flu virus in flu shots. They cannot cause the flu. There are many flu viruses, and they are always changing. Each year a new flu vaccine is made to protect against three or four viruses that are likely to cause disease in the upcoming flu season. But even when the  vaccine doesn't exactly match these viruses, it may still provide some protection. Flu vaccine cannot prevent:  flu that is caused by a virus not covered by the vaccine, or  illnesses that look like flu but are not. It takes about 2 weeks for protection to develop after vaccination, and protection lasts through the flu season. 3. Some people should not get this vaccine Tell the person who is giving you the vaccine:  If you have any severe, life-threatening allergies. If you ever had a life-threatening allergic reaction after a dose of flu vaccine, or have a severe allergy to any part of this vaccine, you may be advised not to get vaccinated. Most, but not all, types of flu vaccine contain a small amount of egg protein.  If you ever had Guillain-Barr Syndrome (also called GBS). Some people with a history of GBS should not get this vaccine. This should be discussed with your doctor.  If you are not feeling well. It is usually okay to get flu vaccine when you have a mild illness, but you might be asked to come back when you feel better. 4. Risks of a vaccine reaction With any medicine, including vaccines, there is a chance of reactions. These are usually mild and go away on their own, but serious reactions are also possible. Most people who get a flu shot do not have any problems with it. Minor problems following a flu shot include:  soreness, redness, or swelling where the shot was given  hoarseness  sore, red or itchy   eyes  cough  fever  aches  headache  itching  fatigue If these problems occur, they usually begin soon after the shot and last 1 or 2 days. More serious problems following a flu shot can include the following:  There may be a small increased risk of Guillain-Barre Syndrome (GBS) after inactivated flu vaccine. This risk has been estimated at 1 or 2 additional cases per million people vaccinated. This is much lower than the risk of severe complications from flu,  which can be prevented by flu vaccine.  Young children who get the flu shot along with pneumococcal vaccine (PCV13) and/or DTaP vaccine at the same time might be slightly more likely to have a seizure caused by fever. Ask your doctor for more information. Tell your doctor if a child who is getting flu vaccine has ever had a seizure. Problems that could happen after any injected vaccine:  People sometimes faint after a medical procedure, including vaccination. Sitting or lying down for about 15 minutes can help prevent fainting, and injuries caused by a fall. Tell your doctor if you feel dizzy, or have vision changes or ringing in the ears.  Some people get severe pain in the shoulder and have difficulty moving the arm where a shot was given. This happens very rarely.  Any medication can cause a severe allergic reaction. Such reactions from a vaccine are very rare, estimated at about 1 in a million doses, and would happen within a few minutes to a few hours after the vaccination. As with any medicine, there is a very remote chance of a vaccine causing a serious injury or death. The safety of vaccines is always being monitored. For more information, visit: www.cdc.gov/vaccinesafety/ 5. What if there is a serious reaction? What should I look for? Look for anything that concerns you, such as signs of a severe allergic reaction, very high fever, or unusual behavior. Signs of a severe allergic reaction can include hives, swelling of the face and throat, difficulty breathing, a fast heartbeat, dizziness, and weakness. These would start a few minutes to a few hours after the vaccination. What should I do?  If you think it is a severe allergic reaction or other emergency that can't wait, call 9-1-1 and get the person to the nearest hospital. Otherwise, call your doctor.  Reactions should be reported to the Vaccine Adverse Event Reporting System (VAERS). Your doctor should file this report, or you can do  it yourself through the VAERS web site at www.vaers.hhs.gov, or by calling 1-800-822-7967.  VAERS does not give medical advice. 6. The National Vaccine Injury Compensation Program The National Vaccine Injury Compensation Program (VICP) is a federal program that was created to compensate people who may have been injured by certain vaccines. Persons who believe they may have been injured by a vaccine can learn about the program and about filing a claim by calling 1-800-338-2382 or visiting the VICP website at www.hrsa.gov/vaccinecompensation. There is a time limit to file a claim for compensation. 7. How can I learn more?  Ask your healthcare provider. He or she can give you the vaccine package insert or suggest other sources of information.  Call your local or state health department.  Contact the Centers for Disease Control and Prevention (CDC):  Call 1-800-232-4636 (1-800-CDC-INFO) or  Visit CDC's website at www.cdc.gov/flu Vaccine Information Statement, Inactivated Influenza Vaccine (09/19/2013) This information is not intended to replace advice given to you by your health care provider. Make sure you discuss any questions you   have with your health care provider. Document Released: 11/24/2005 Document Revised: 10/21/2015 Document Reviewed: 10/21/2015 Elsevier Interactive Patient Education  2017 [place], Female Introduction Adopting a healthy lifestyle and getting preventive care can go a long way to promote health and wellness. Talk with your health care provider about what schedule of regular examinations is right for you. This is a good chance for you to check in with your provider about disease prevention and staying healthy. In between checkups, there are plenty of things you can do on your own. Experts have done a lot of research about which lifestyle changes and preventive measures are most likely to keep you healthy. Ask your health care provider for more  information. Weight and diet Eat a healthy diet  Be sure to include plenty of vegetables, fruits, low-fat dairy products, and lean protein.  Do not eat a lot of foods high in solid fats, added sugars, or salt.  Get regular exercise. This is one of the most important things you can do for your health.  Most adults should exercise for at least 150 minutes each week. The exercise should increase your heart rate and make you sweat (moderate-intensity exercise).  Most adults should also do strengthening exercises at least twice a week. This is in addition to the moderate-intensity exercise. Maintain a healthy weight  Body mass index (BMI) is a measurement that can be used to identify possible weight problems. It estimates body fat based on height and weight. Your health care provider can help determine your BMI and help you achieve or maintain a healthy weight.  For females 89 years of age and older:  A BMI below 18.5 is considered underweight.  A BMI of 18.5 to 24.9 is normal.  A BMI of 25 to 29.9 is considered overweight.  A BMI of 30 and above is considered obese. Watch levels of cholesterol and blood lipids  You should start having your blood tested for lipids and cholesterol at 35 years of age, then have this test every 5 years.  You may need to have your cholesterol levels checked more often if:  Your lipid or cholesterol levels are high.  You are older than 35 years of age.  You are at high risk for heart disease. Cancer screening Lung Cancer  Lung cancer screening is recommended for adults 31-15 years old who are at high risk for lung cancer because of a history of smoking.  A yearly low-dose CT scan of the lungs is recommended for people who:  Currently smoke.  Have quit within the past 15 years.  Have at least a 30-pack-year history of smoking. A pack year is smoking an average of one pack of cigarettes a day for 1 year.  Yearly screening should continue until  it has been 15 years since you quit.  Yearly screening should stop if you develop a health problem that would prevent you from having lung cancer treatment. Breast Cancer  Practice breast self-awareness. This means understanding how your breasts normally appear and feel.  It also means doing regular breast self-exams. Let your health care provider know about any changes, no matter how small.  If you are in your 20s or 30s, you should have a clinical breast exam (CBE) by a health care provider every 1-3 years as part of a regular health exam.  If you are 14 or older, have a CBE every year. Also consider having a breast X-ray (mammogram) every year.  If you have a  family history of breast cancer, talk to your health care provider about genetic screening.  If you are at high risk for breast cancer, talk to your health care provider about having an MRI and a mammogram every year.  Breast cancer gene (BRCA) assessment is recommended for women who have family members with BRCA-related cancers. BRCA-related cancers include:  Breast.  Ovarian.  Tubal.  Peritoneal cancers.  Results of the assessment will determine the need for genetic counseling and BRCA1 and BRCA2 testing. Cervical Cancer  Your health care provider may recommend that you be screened regularly for cancer of the pelvic organs (ovaries, uterus, and vagina). This screening involves a pelvic examination, including checking for microscopic changes to the surface of your cervix (Pap test). You may be encouraged to have this screening done every 3 years, beginning at age 53.  For women ages 69-65, health care providers may recommend pelvic exams and Pap testing every 3 years, or they may recommend the Pap and pelvic exam, combined with testing for human papilloma virus (HPV), every 5 years. Some types of HPV increase your risk of cervical cancer. Testing for HPV may also be done on women of any age with unclear Pap test  results.  Other health care providers may not recommend any screening for nonpregnant women who are considered low risk for pelvic cancer and who do not have symptoms. Ask your health care provider if a screening pelvic exam is right for you.  If you have had past treatment for cervical cancer or a condition that could lead to cancer, you need Pap tests and screening for cancer for at least 20 years after your treatment. If Pap tests have been discontinued, your risk factors (such as having a new sexual partner) need to be reassessed to determine if screening should resume. Some women have medical problems that increase the chance of getting cervical cancer. In these cases, your health care provider may recommend more frequent screening and Pap tests. Colorectal Cancer  This type of cancer can be detected and often prevented.  Routine colorectal cancer screening usually begins at 35 years of age and continues through 35 years of age.  Your health care provider may recommend screening at an earlier age if you have risk factors for colon cancer.  Your health care provider may also recommend using home test kits to check for hidden blood in the stool.  A small camera at the end of a tube can be used to examine your colon directly (sigmoidoscopy or colonoscopy). This is done to check for the earliest forms of colorectal cancer.  Routine screening usually begins at age 11.  Direct examination of the colon should be repeated every 5-10 years through 35 years of age. However, you may need to be screened more often if early forms of precancerous polyps or small growths are found. Skin Cancer  Check your skin from head to toe regularly.  Tell your health care provider about any new moles or changes in moles, especially if there is a change in a mole's shape or color.  Also tell your health care provider if you have a mole that is larger than the size of a pencil eraser.  Always use sunscreen.  Apply sunscreen liberally and repeatedly throughout the day.  Protect yourself by wearing long sleeves, pants, a wide-brimmed hat, and sunglasses whenever you are outside. Heart disease, diabetes, and high blood pressure  High blood pressure causes heart disease and increases the risk of stroke. High blood  pressure is more likely to develop in:  People who have blood pressure in the high end of the normal range (130-139/85-89 mm Hg).  People who are overweight or obese.  People who are African American.  If you are 93-24 years of age, have your blood pressure checked every 3-5 years. If you are 38 years of age or older, have your blood pressure checked every year. You should have your blood pressure measured twice-once when you are at a hospital or clinic, and once when you are not at a hospital or clinic. Record the average of the two measurements. To check your blood pressure when you are not at a hospital or clinic, you can use:  An automated blood pressure machine at a pharmacy.  A home blood pressure monitor.  If you are between 70 years and 11 years old, ask your health care provider if you should take aspirin to prevent strokes.  Have regular diabetes screenings. This involves taking a blood sample to check your fasting blood sugar level.  If you are at a normal weight and have a low risk for diabetes, have this test once every three years after 35 years of age.  If you are overweight and have a high risk for diabetes, consider being tested at a younger age or more often. Preventing infection Hepatitis B  If you have a higher risk for hepatitis B, you should be screened for this virus. You are considered at high risk for hepatitis B if:  You were born in a country where hepatitis B is common. Ask your health care provider which countries are considered high risk.  Your parents were born in a high-risk country, and you have not been immunized against hepatitis B (hepatitis B  vaccine).  You have HIV or AIDS.  You use needles to inject street drugs.  You live with someone who has hepatitis B.  You have had sex with someone who has hepatitis B.  You get hemodialysis treatment.  You take certain medicines for conditions, including cancer, organ transplantation, and autoimmune conditions. Hepatitis C  Blood testing is recommended for:  Everyone born from [birth year] through 1964.  Anyone with known risk factors for hepatitis C. Sexually transmitted infections (STIs)  You should be screened for sexually transmitted infections (STIs) including gonorrhea and chlamydia if:  You are sexually active and are younger than 35 years of age.  You are older than 35 years of age and your health care provider tells you that you are at risk for this type of infection.  Your sexual activity has changed since you were last screened and you are at an increased risk for chlamydia or gonorrhea. Ask your health care provider if you are at risk.  If you do not have HIV, but are at risk, it may be recommended that you take a prescription medicine daily to prevent HIV infection. This is called pre-exposure prophylaxis (PrEP). You are considered at risk if:  You are sexually active and do not regularly use condoms or know the HIV status of your partner(s).  You take drugs by injection.  You are sexually active with a partner who has HIV. Talk with your health care provider about whether you are at high risk of being infected with HIV. If you choose to begin PrEP, you should first be tested for HIV. You should then be tested every 3 months for as long as you are taking PrEP. Pregnancy  If you are premenopausal and you may become  pregnant, ask your health care provider about preconception counseling.  If you may become pregnant, take 400 to 800 micrograms (mcg) of folic acid every day.  If you want to prevent pregnancy, talk to your health care provider about birth control  (contraception). Osteoporosis and menopause  Osteoporosis is a disease in which the bones lose minerals and strength with aging. This can result in serious bone fractures. Your risk for osteoporosis can be identified using a bone density scan.  If you are 50 years of age or older, or if you are at risk for osteoporosis and fractures, ask your health care provider if you should be screened.  Ask your health care provider whether you should take a calcium or vitamin D supplement to lower your risk for osteoporosis.  Menopause may have certain physical symptoms and risks.  Hormone replacement therapy may reduce some of these symptoms and risks. Talk to your health care provider about whether hormone replacement therapy is right for you. Follow these instructions at home:  Schedule regular health, dental, and eye exams.  Stay current with your immunizations.  Do not use any tobacco products including cigarettes, chewing tobacco, or electronic cigarettes.  If you are pregnant, do not drink alcohol.  If you are breastfeeding, limit how much and how often you drink alcohol.  Limit alcohol intake to no more than 1 drink per day for nonpregnant women. One drink equals 12 ounces of beer, 5 ounces of wine, or 1 ounces of hard liquor.  Do not use street drugs.  Do not share needles.  Ask your health care provider for help if you need support or information about quitting drugs.  Tell your health care provider if you often feel depressed.  Tell your health care provider if you have ever been abused or do not feel safe at home. This information is not intended to replace advice given to you by your health care provider. Make sure you discuss any questions you have with your health care provider. Document Released: 08/15/2010 Document Revised: 07/08/2015 Document Reviewed: 11/03/2014  2017 Elsevier

## 2016-03-03 ENCOUNTER — Other Ambulatory Visit: Payer: PRIVATE HEALTH INSURANCE

## 2016-03-03 DIAGNOSIS — Z8719 Personal history of other diseases of the digestive system: Secondary | ICD-10-CM

## 2016-03-03 DIAGNOSIS — Z8711 Personal history of peptic ulcer disease: Secondary | ICD-10-CM

## 2016-03-03 LAB — FECAL OCCULT BLOOD, GUAIAC
Specimen 1: NEGATIVE
Specimen 2: NEGATIVE
Specimen 3: NEGATIVE

## 2016-04-19 ENCOUNTER — Encounter: Payer: Self-pay | Admitting: Obstetrics & Gynecology

## 2016-04-19 ENCOUNTER — Ambulatory Visit (INDEPENDENT_AMBULATORY_CARE_PROVIDER_SITE_OTHER): Payer: No Typology Code available for payment source | Admitting: Obstetrics & Gynecology

## 2016-04-19 VITALS — BP 118/78 | Ht 66.0 in | Wt 118.0 lb

## 2016-04-19 DIAGNOSIS — Z0000 Encounter for general adult medical examination without abnormal findings: Secondary | ICD-10-CM | POA: Diagnosis not present

## 2016-04-19 NOTE — Patient Instructions (Signed)

## 2016-04-19 NOTE — Progress Notes (Signed)
HPI:      [name] is a 36 y.o. G2P2 who LMP was Patient's last menstrual period was 04/10/2016., some bleeding irreg w Mirena; she presents today for her annual examination. The patient has no complaints today. The patient is sexually active.   last pap: was normal  The patient has regular exercise: yes.    GYN History: Contraception: IUD  PMHx: She  has a past medical history of Anxiety and Bleeding ulcer (2012). Also,  has no past surgical history on file., family history includes Anemia in her mother; Aneurysm in her paternal grandmother; COPD in her paternal grandfather; Heart disease in her maternal grandmother; Hyperlipidemia in her father; Hypertension in her father.,  reports that she has never smoked. She has never used smokeless tobacco. She reports that she drinks about 4.2 oz of alcohol per week . She reports that she does not use drugs.  She has a current medication list which includes the following prescription(s): levonorgestrel and hesperidin-diosmin. Also, is allergic to nsaids.  Review of Systems  Constitutional: Negative for chills, fever and malaise/fatigue.  HENT: Negative for congestion, sinus pain and sore throat.   Eyes: Negative for blurred vision and pain.  Respiratory: Negative for cough and wheezing.   Cardiovascular: Negative for chest pain and leg swelling.  Gastrointestinal: Negative for abdominal pain, constipation, diarrhea, heartburn, nausea and vomiting.  Genitourinary: Negative for dysuria, frequency, hematuria and urgency.  Musculoskeletal: Negative for back pain, joint pain, myalgias and neck pain.  Skin: Negative for itching and rash.  Neurological: Negative for dizziness, tremors and weakness.  Endo/Heme/Allergies: Does not bruise/bleed easily.  Psychiatric/Behavioral: Negative for depression. The patient is not nervous/anxious and does not have insomnia.     Objective: Vitals:   04/19/16 1523  BP: 118/78   Physical Exam   Constitutional: She is oriented to person, place, and time. She appears well-developed and well-nourished. No distress.  Genitourinary: Rectum normal, vagina normal and uterus normal. Pelvic exam was performed with patient supine. There is no rash or lesion on the right labia. There is no rash or lesion on the left labia. Vagina exhibits no lesion. No bleeding in the vagina. Right adnexum does not display mass and does not display tenderness. Left adnexum does not display mass and does not display tenderness. Cervix does not exhibit motion tenderness, lesion, friability or polyp.   Uterus is mobile and midaxial. Uterus is not enlarged or exhibiting a mass.  Genitourinary Comments: IUD string 2 cm  HENT:  Head: Normocephalic and atraumatic. Head is without laceration.  Right Ear: Hearing normal.  Left Ear: Hearing normal.  Nose: No epistaxis.  No foreign bodies.  Mouth/Throat: Uvula is midline, oropharynx is clear and moist and mucous membranes are normal.  Eyes: Pupils are equal, round, and reactive to light.  Neck: Normal range of motion. Neck supple. No thyromegaly present.  Cardiovascular: Normal rate and regular rhythm.  Exam reveals no gallop and no friction rub.   No murmur heard. Pulmonary/Chest: Effort normal and breath sounds normal. No respiratory distress. She has no wheezes.  Abdominal: Soft. Bowel sounds are normal. She exhibits no distension. There is no tenderness. There is no rebound.  Musculoskeletal: Normal range of motion.  Neurological: She is alert and oriented to person, place, and time. No cranial nerve deficit.  Skin: Skin is warm and dry.  Psychiatric: She has a normal mood and affect. Judgment normal.  Vitals reviewed.   Assessment:  ANNUAL EXAM 1. Annual physical exam  Plan:            1.  Cervical Screening-  Pap smear done today  2. Breast screening- Exam annually and mammogram>40 planned   3. Colonoscopy every 10 years, Hemoccult testing - after  age 48  4. Labs per PCP or scheduled here  5. Counseling for contraception: IUD  Problem List Items Addressed This Visit    None    Visit Diagnoses    Annual physical exam    -  Primary   Relevant Orders   Pap IG and HPV (high risk) DNA detection      1. Annual physical exam - Pap IG and HPV (high risk) DNA detection - F/U  Return in about 1 year (around 04/19/2017) for Annual.  Barnett Applebaum, MD, [facility], [facility] 04/19/2016  4:01 PM

## 2016-04-21 LAB — PAP IG AND HPV HIGH-RISK
HPV, HIGH-RISK: NEGATIVE
PAP SMEAR COMMENT: 0

## 2016-07-26 ENCOUNTER — Encounter: Payer: Self-pay | Admitting: Family Medicine

## 2016-07-26 ENCOUNTER — Ambulatory Visit (INDEPENDENT_AMBULATORY_CARE_PROVIDER_SITE_OTHER): Payer: BLUE CROSS/BLUE SHIELD | Admitting: Family Medicine

## 2016-07-26 VITALS — BP 114/79 | HR 79 | Temp 98.4°F | Wt 119.0 lb

## 2016-07-26 DIAGNOSIS — L03116 Cellulitis of left lower limb: Secondary | ICD-10-CM

## 2016-07-26 MED ORDER — AMOXICILLIN 500 MG PO CAPS: 500.0000 mg | ORAL_CAPSULE | Freq: Three times a day (TID) | ORAL | 0 refills | Status: DC

## 2016-07-26 NOTE — Progress Notes (Signed)
   BP 114/79   Pulse 79   Temp 98.4 F (36.9 C)   Wt 119 lb (54 kg)   SpO2 100%   BMI 19.21 kg/m    Subjective:    Patient ID: Debbie Green, female    DOB: [birth year], 36 y.o.   MRN: [id number]  HPI: Debbie Green is a 36 y.o. female  Chief Complaint  Patient presents with  . Rash    tick bite on Monday, now has red, itching rash inside of her left thigh. Not painful.    Patient presents with worsening redness around tick bite that happened 2 days ago. Area surrounding has become inflamed and red, tender but not painful. Denies drainage or itching, fever, chills, aches, HA, fatigue. Has been putting an antihistamine hydrocortisone gel on the area.   Relevant past medical, surgical, family and social history reviewed and updated as indicated. Interim medical history since our last visit reviewed. Allergies and medications reviewed and updated.  Review of Systems  Constitutional: Negative.   Respiratory: Negative.   Cardiovascular: Negative.   Genitourinary: Negative.   Musculoskeletal: Negative.   Skin: Positive for rash.  Neurological: Negative.   Psychiatric/Behavioral: Negative.    Per HPI unless specifically indicated above     Objective:    BP 114/79   Pulse 79   Temp 98.4 F (36.9 C)   Wt 119 lb (54 kg)   SpO2 100%   BMI 19.21 kg/m   Wt Readings from Last 3 Encounters:  07/26/16 119 lb (54 kg)  04/19/16 118 lb (53.5 kg)  03/24/15 122 lb (55.3 kg)    Physical Exam  Constitutional: She is oriented to person, place, and time. She appears well-developed and well-nourished. No distress.  HENT:  Head: Atraumatic.  Eyes: Conjunctivae are normal. Pupils are equal, round, and reactive to light. No scleral icterus.  Neck: Normal range of motion. Neck supple.  Cardiovascular: Normal rate.   Pulmonary/Chest: Effort normal and breath sounds normal. No respiratory distress.  Musculoskeletal: Normal range of motion. She exhibits no tenderness or  deformity.  Neurological: She is alert and oriented to person, place, and time.  Skin: Skin is warm and dry. There is erythema (Diffuse erythema surrounding tick bite wound on upper left thigh. Central area quite edematous, non-fluctuant, no drainage).  Psychiatric: She has a normal mood and affect. Her behavior is normal.  Nursing note and vitals reviewed.     Assessment & Plan:   Problem List Items Addressed This Visit    None    Visit Diagnoses    Cellulitis of left lower extremity    -  Primary    Discussed with pt that this appeared to be a cellulitis rash rather than a Lyme dz rash, and that it was likely too early for labs to pick up tick dz antibodies. Recommended doxycycline to cover for both, pt had a bad experience with doxy in the past and would prefer second line therapy. Amoxicillin sent for 2 week course. Discussed sxs to watch for such as fever, chills, aches, fatigue, HAs. Keep area clean and dry, neosporin twice daily.     Follow up plan: Return if symptoms worsen or fail to improve.

## 2016-07-31 ENCOUNTER — Ambulatory Visit (INDEPENDENT_AMBULATORY_CARE_PROVIDER_SITE_OTHER): Payer: BLUE CROSS/BLUE SHIELD | Admitting: Advanced Practice Midwife

## 2016-07-31 ENCOUNTER — Encounter: Payer: Self-pay | Admitting: Advanced Practice Midwife

## 2016-07-31 VITALS — BP 120/70 | HR 93 | Ht 66.0 in | Wt 120.0 lb

## 2016-07-31 DIAGNOSIS — R102 Pelvic and perineal pain: Secondary | ICD-10-CM | POA: Diagnosis not present

## 2016-07-31 NOTE — Progress Notes (Signed)
S: Patient is here today with c/o pelvic cramping that has probably been happening for about the past year. It is intermittent and had not happened recently when she was here for an annual in March, so she did not mention it then. She has been noticing it more frequently since then and it seems to be mid cycle. She has a monthly period that lasts about 5-6 days and is of medium flow. She denies severe cramps or clots. She denies fever. She did have a tick bite last week and is now on amoxicillin. She has a history of cervicitis but unsure of the etiology. She was on doxycycline for that and may have had an allergic reaction to it. She is considering removal of the IUD if the cramping worsens  O: Vital Signs: BP 120/70   Pulse 93   Ht 5\' 6"  (1.676 m)   Wt 120 lb (54.4 kg)   LMP 07/08/2016   BMI 19.37 kg/m  Constitutional: Well nourished, well developed female in no acute distress.  HEENT: normal Skin: Warm and dry.  Cardiovascular: Regular rate and rhythm.   Extremity: no evidence of DVT  Respiratory: Clear to auscultation bilateral. Normal respiratory effort Abdomen: soft, nontender, nondistended, no abnormal masses, no epigastric pain Back: no CVAT Neuro: DTRs 2+, Cranial nerves grossly intact Psych: Alert and Oriented x3. No memory deficits. Normal mood and affect.  MS: normal gait, normal bilateral lower extremity ROM/strength/stability.  Pelvic exam:  is not limited by body habitus EGBUS: within normal limits Vagina: within normal limits and with normal mucosa  Cervix: columnar cells seen, no CMT, strings are barely visible at cervix Uterus: mild tenderness to palpation, not enlarged Ovaries: non tender to palpation, not enlarged  A: 36 y.o. Female with mild pelvic cramping, short IUD strings  P: Offered U/S for placement of IUD. Patient prefers to wait on U/S and decision to remove IUD. RTC for worsening of symptoms  Rod Can, CNM

## 2016-09-28 ENCOUNTER — Encounter: Payer: Self-pay | Admitting: Advanced Practice Midwife

## 2016-09-29 ENCOUNTER — Other Ambulatory Visit: Payer: Self-pay | Admitting: Advanced Practice Midwife

## 2016-09-29 DIAGNOSIS — R102 Pelvic and perineal pain: Secondary | ICD-10-CM

## 2016-11-08 ENCOUNTER — Encounter: Payer: Self-pay | Admitting: Family Medicine

## 2016-11-09 ENCOUNTER — Other Ambulatory Visit: Payer: Self-pay | Admitting: Family Medicine

## 2016-11-09 DIAGNOSIS — R300 Dysuria: Secondary | ICD-10-CM

## 2016-11-16 ENCOUNTER — Ambulatory Visit (INDEPENDENT_AMBULATORY_CARE_PROVIDER_SITE_OTHER): Payer: BLUE CROSS/BLUE SHIELD | Admitting: Advanced Practice Midwife

## 2016-11-16 ENCOUNTER — Ambulatory Visit (INDEPENDENT_AMBULATORY_CARE_PROVIDER_SITE_OTHER): Payer: BLUE CROSS/BLUE SHIELD

## 2016-11-16 ENCOUNTER — Encounter: Payer: Self-pay | Admitting: Advanced Practice Midwife

## 2016-11-16 VITALS — BP 110/64 | Wt 123.0 lb

## 2016-11-16 DIAGNOSIS — R102 Pelvic and perineal pain: Secondary | ICD-10-CM

## 2016-11-16 DIAGNOSIS — Z09 Encounter for follow-up examination after completed treatment for conditions other than malignant neoplasm: Secondary | ICD-10-CM | POA: Diagnosis not present

## 2016-11-16 NOTE — Progress Notes (Signed)
S: The patient is here today for Gyn ultrasound and f/u from visit in June of this year. Her periods have been regular and of normal flow since her visit and she has continued to have some mild cramping in between periods. She decided to have the ultrasound to know if everything was normal. Reassurance given of normal findings.  O: BP 110/64   Wt 123 lb (55.8 kg)   LMP 11/07/2016   BMI 19.85 kg/m   Results for Debbie Green, Debbie Green (MRN [id number]) as of 11/16/2016 11:13  Ref. Range 11/16/2016 11:06  US PELVIS TRANSVAGINAL NON-OB (TV ONLY) Unknown Rpt  Results of Ultrasound are normal findings. The IUD appears to be in the correct location. The strings appear to be 1.64 cm from the external os. The ovaries are normal size with follicles.   A: 36 yo female with gyn u/s normal findings  P: Return to clinic PRN and in 1 year for annual exam Continue with IUD until 5 year expiration in approximately 2 years  Rod Can, [place]

## 2017-08-27 ENCOUNTER — Encounter: Payer: Self-pay | Admitting: Family Medicine

## 2017-08-30 ENCOUNTER — Ambulatory Visit (INDEPENDENT_AMBULATORY_CARE_PROVIDER_SITE_OTHER): Payer: BLUE CROSS/BLUE SHIELD | Admitting: Family Medicine

## 2017-08-30 ENCOUNTER — Other Ambulatory Visit: Payer: Self-pay

## 2017-08-30 ENCOUNTER — Encounter: Payer: Self-pay | Admitting: Family Medicine

## 2017-08-30 VITALS — BP 114/73 | HR 75 | Temp 98.1°F | Ht 64.96 in | Wt 123.2 lb

## 2017-08-30 DIAGNOSIS — R8290 Unspecified abnormal findings in urine: Secondary | ICD-10-CM | POA: Diagnosis not present

## 2017-08-30 DIAGNOSIS — Z0001 Encounter for general adult medical examination with abnormal findings: Secondary | ICD-10-CM

## 2017-08-30 DIAGNOSIS — R0982 Postnasal drip: Secondary | ICD-10-CM | POA: Diagnosis not present

## 2017-08-30 DIAGNOSIS — L409 Psoriasis, unspecified: Secondary | ICD-10-CM | POA: Diagnosis not present

## 2017-08-30 DIAGNOSIS — S0992XA Unspecified injury of nose, initial encounter: Secondary | ICD-10-CM

## 2017-08-30 DIAGNOSIS — Z0000 Encounter for general adult medical examination without abnormal findings: Secondary | ICD-10-CM

## 2017-08-30 LAB — MICROSCOPIC EXAMINATION

## 2017-08-30 LAB — UA/M W/RFLX CULTURE, ROUTINE
BILIRUBIN UA: NEGATIVE
Glucose, UA: NEGATIVE
KETONES UA: NEGATIVE
Nitrite, UA: NEGATIVE
PH UA: 6.5 (ref 5.0–7.5)
Protein, UA: NEGATIVE
UUROB: 0.2 mg/dL (ref 0.2–1.0)

## 2017-08-30 MED ORDER — FLUTICASONE PROPIONATE 50 MCG/ACT NA SUSP: 2.0000 | Freq: Every day | NASAL | 6 refills | Status: DC

## 2017-08-30 MED ORDER — CETIRIZINE HCL 10 MG PO TABS: 10.0000 mg | ORAL_TABLET | Freq: Every day | ORAL | 11 refills | Status: DC

## 2017-08-30 MED ORDER — FLUOCINOLONE ACETONIDE 0.01 % EX SOLN: Freq: Two times a day (BID) | CUTANEOUS | 6 refills | Status: DC

## 2017-08-30 MED ORDER — BENZONATATE 200 MG PO CAPS: 200.0000 mg | ORAL_CAPSULE | Freq: Three times a day (TID) | ORAL | 0 refills | Status: DC | PRN

## 2017-08-30 NOTE — Progress Notes (Signed)
BP 114/73   Pulse 75   Temp 98.1 F (36.7 C) (Oral)   Ht 5' 4.96" (1.65 m)   Wt 123 lb 3 oz (55.9 kg)   SpO2 100%   BMI 20.52 kg/m    Subjective:    Patient ID: Debbie Green, female    DOB: 1980/06/26, 37 y.o.   MRN: [id number]  HPI: Debbie Green is a 37 y.o. female presenting on 08/30/2017 for comprehensive medical examination. Current medical complaints include:see below  Child ran into her nose 1.5 weeks ago on accident and she's afraid she broke it. Did not bleed. Swollen near upper bridge but swelling has come down since onset. No difficulty breathing through nose since.   Post nasal drainage causing a tickling cough intermittently throughout the year. Thinking maybe some mild allergies. Has not tried anything OTC for it. Denies wheezing, chest tightness, SOB.   Hx of scalp psoriasis. Out of medication. Does not regularly see Dermatologist. Has always been well controlled with steroid solutions.   Depression Screen done today and results listed below:  Depression screen [facility] 2/9 08/30/2017 02/22/2016  Decreased Interest 0 0  Down, Depressed, Hopeless 0 0  PHQ - 2 Score 0 0  Altered sleeping 0 -  Tired, decreased energy 0 -  Change in appetite 0 -  Feeling bad or failure about yourself  0 -  Trouble concentrating 0 -  Moving slowly or fidgety/restless 0 -  Suicidal thoughts 0 -  PHQ-9 Score 0 -  Difficult doing work/chores Not difficult at all -    The patient does not have a history of falls. I did not complete a risk assessment for falls. A plan of care for falls was not documented.   Past Medical History:  Past Medical History:  Diagnosis Date  . Anxiety   . Bleeding ulcer 2012    Surgical History:  History reviewed. No pertinent surgical history.  Medications:  Current Outpatient Medications on File Prior to Visit  Medication Sig  . amoxicillin (AMOXIL) 500 MG capsule Take 1 capsule (500 mg total) by mouth 3 (three) times daily.  Marland Kitchen  levonorgestrel (MIRENA) 20 MCG/24HR IUD 1 each by Intrauterine route once.   No current facility-administered medications on file prior to visit.     Allergies:  Allergies  Allergen Reactions  . Nsaids     Social History:  Social History   Socioeconomic History  . Marital status: Married    Spouse name: Not on file  . Number of children: Not on file  . Years of education: Not on file  . Highest education level: Not on file  Occupational History  . Not on file  Social Needs  . Financial resource strain: Not on file  . Food insecurity:    Worry: Not on file    Inability: Not on file  . Transportation needs:    Medical: Not on file    Non-medical: Not on file  Tobacco Use  . Smoking status: Never Smoker  . Smokeless tobacco: Never Used  Substance and Sexual Activity  . Alcohol use: Yes    Alcohol/week: 4.2 oz    Types: 7 Glasses of wine per week    Comment: social  . Drug use: No  . Sexual activity: Yes    Birth control/protection: IUD  Lifestyle  . Physical activity:    Days per week: Not on file    Minutes per session: Not on file  . Stress: Not on file  Relationships  . Social connections:    Talks on phone: Not on file    Gets together: Not on file    Attends religious service: Not on file    Active member of club or organization: Not on file    Attends meetings of clubs or organizations: Not on file    Relationship status: Not on file  . Intimate partner violence:    Fear of current or ex partner: Not on file    Emotionally abused: Not on file    Physically abused: Not on file    Forced sexual activity: Not on file  Other Topics Concern  . Not on file  Social History Narrative  . Not on file   Social History   Tobacco Use  Smoking Status Never Smoker  Smokeless Tobacco Never Used   Social History   Substance and Sexual Activity  Alcohol Use Yes  . Alcohol/week: 4.2 oz  . Types: 7 Glasses of wine per week   Comment: social    Family  History:  Family History  Problem Relation Age of Onset  . Anemia Mother   . Hyperlipidemia Father   . Hypertension Father   . Heart disease Maternal Grandmother   . Aneurysm Paternal Grandmother        Brain  . COPD Paternal Grandfather     Past medical history, surgical history, medications, allergies, family history and social history reviewed with patient today and changes made to appropriate areas of the chart.   Review of Systems - General ROS: negative Psychological ROS: negative Ophthalmic ROS: negative ENT ROS: positive for - post nasal drainage and nasal pain s/p injury Allergy and Immunology ROS: positive for - postnasal drip Breast ROS: negative for breast lumps Respiratory ROS: positive for - cough Cardiovascular ROS: no chest pain or dyspnea on exertion Gastrointestinal ROS: no abdominal pain, change in bowel habits, or black or bloody stools Genito-Urinary ROS: no dysuria, trouble voiding, or hematuria Musculoskeletal ROS: negative Neurological ROS: no TIA or stroke symptoms Dermatological ROS: positive for psoriasis All other ROS negative except what is listed above and in the HPI.      Objective:    BP 114/73   Pulse 75   Temp 98.1 F (36.7 C) (Oral)   Ht 5' 4.96" (1.65 m)   Wt 123 lb 3 oz (55.9 kg)   SpO2 100%   BMI 20.52 kg/m   Wt Readings from Last 3 Encounters:  08/30/17 123 lb 3 oz (55.9 kg)  11/16/16 123 lb (55.8 kg)  07/31/16 120 lb (54.4 kg)    Physical Exam  Constitutional: She is oriented to person, place, and time. She appears well-developed and well-nourished. No distress.  HENT:  Head: Atraumatic.  Right Ear: External ear normal.  Left Ear: External ear normal.  Nose: Nose normal.  Mouth/Throat: Oropharynx is clear and moist. No oropharyngeal exudate.  Eyes: Pupils are equal, round, and reactive to light. Conjunctivae are normal. No scleral icterus.  Neck: Normal range of motion. Neck supple. No thyromegaly present.    Cardiovascular: Normal rate, regular rhythm, normal heart sounds and intact distal pulses.  Pulmonary/Chest: Effort normal and breath sounds normal. No respiratory distress. Right breast exhibits no mass, no skin change and no tenderness. Left breast exhibits no mass, no skin change and no tenderness.  Abdominal: Soft. Bowel sounds are normal. She exhibits no mass. There is no tenderness.  Musculoskeletal: Normal range of motion. She exhibits no edema or tenderness.  Lymphadenopathy:  She has no cervical adenopathy.  Neurological: She is alert and oriented to person, place, and time. No cranial nerve deficit.  Skin: Skin is warm and dry. No rash noted.  Psychiatric: She has a normal mood and affect. Her behavior is normal.  Nursing note and vitals reviewed.   Results for orders placed or performed in visit on 08/30/17  Microscopic Examination  Result Value Ref Range   WBC, UA 11-30 (A) 0 - 5 /hpf   RBC, UA 0-2 0 - 2 /hpf   Epithelial Cells (non renal) 0-10 0 - 10 /hpf   Bacteria, UA Few None seen/Few  Urine Culture  Result Value Ref Range   Urine Culture, Routine Final report (A)    Organism ID, Bacteria Comment (A)    ORGANISM ID, BACTERIA Comment   CBC with Differential/Platelet  Result Value Ref Range   WBC 4.4 3.4 - 10.8 x10E3/uL   RBC 4.59 3.77 - 5.28 x10E6/uL   Hemoglobin 13.5 11.1 - 15.9 g/dL   Hematocrit 41.4 34.0 - 46.6 %   MCV 90 79 - 97 fL   MCH 29.4 26.6 - 33.0 pg   MCHC 32.6 31.5 - 35.7 g/dL   RDW 13.3 12.3 - 15.4 %   Platelets 243 150 - 450 x10E3/uL   Neutrophils 60 Not Estab. %   Lymphs 29 Not Estab. %   Monocytes 8 Not Estab. %   Eos 2 Not Estab. %   Basos 1 Not Estab. %   Neutrophils Absolute 2.7 1.4 - 7.0 x10E3/uL   Lymphocytes Absolute 1.3 0.7 - 3.1 x10E3/uL   Monocytes Absolute 0.4 0.1 - 0.9 x10E3/uL   EOS (ABSOLUTE) 0.1 0.0 - 0.4 x10E3/uL   Basophils Absolute 0.0 0.0 - 0.2 x10E3/uL   Immature Granulocytes 0 Not Estab. %   Immature Grans (Abs) 0.0  0.0 - 0.1 x10E3/uL  Comprehensive metabolic panel  Result Value Ref Range   Glucose 89 65 - 99 mg/dL   BUN 10 6 - 20 mg/dL   Creatinine, Ser 0.79 0.57 - 1.00 mg/dL   GFR calc non Af Amer 97 >59 mL/min/1.73   GFR calc Af Amer 112 >59 mL/min/1.73   BUN/Creatinine Ratio 13 9 - 23   Sodium 142 134 - 144 mmol/L   Potassium 3.7 3.5 - 5.2 mmol/L   Chloride 104 96 - 106 mmol/L   CO2 24 20 - 29 mmol/L   Calcium 9.3 8.7 - 10.2 mg/dL   Total Protein 7.0 6.0 - 8.5 g/dL   Albumin 4.8 3.5 - 5.5 g/dL   Globulin, Total 2.2 1.5 - 4.5 g/dL   Albumin/Globulin Ratio 2.2 1.2 - 2.2   Bilirubin Total 0.9 0.0 - 1.2 mg/dL   Alkaline Phosphatase 69 39 - 117 IU/L   AST 17 0 - 40 IU/L   ALT 11 0 - 32 IU/L  Lipid Panel w/o Chol/HDL Ratio  Result Value Ref Range   Cholesterol, Total 211 (H) 100 - 199 mg/dL   Triglycerides 71 0 - 149 mg/dL   HDL 82 >39 mg/dL   VLDL Cholesterol Cal 14 5 - 40 mg/dL   LDL Calculated 115 (H) 0 - 99 mg/dL  TSH  Result Value Ref Range   TSH 1.500 0.450 - 4.500 uIU/mL  UA/M w/rflx Culture, Routine  Result Value Ref Range   Specific Gravity, UA <1.005 (L) 1.005 - 1.030   pH, UA 6.5 5.0 - 7.5   Color, UA Straw Yellow   Appearance Ur Cloudy (A) Clear  Leukocytes, UA 2+ (A) Negative   Protein, UA Negative Negative/Trace   Glucose, UA Negative Negative   Ketones, UA Negative Negative   RBC, UA 2+ (A) Negative   Bilirubin, UA Negative Negative   Urobilinogen, Ur 0.2 0.2 - 1.0 mg/dL   Nitrite, UA Negative Negative   Microscopic Examination See below:       Assessment & Plan:   Problem List Items Addressed This Visit      Musculoskeletal and Integument   Psoriasis - Primary    Other Visit Diagnoses    Annual physical exam       Relevant Orders   CBC with Differential/Platelet (Completed)   Comprehensive metabolic panel (Completed)   Lipid Panel w/o Chol/HDL Ratio (Completed)   TSH (Completed)   UA/M w/rflx Culture, Routine (Completed)   Abnormal urinalysis        Relevant Orders   Urine Culture (Completed)   Post-nasal drip       Injury of nose, initial encounter           Follow up plan: Return in about 1 year (around 08/31/2018) for CPE.   LABORATORY TESTING:  - Pap smear: up to date  IMMUNIZATIONS:   - Tdap: Tetanus vaccination status reviewed: last tetanus booster within 10 years. - Influenza: Postponed to flu season  PATIENT COUNSELING:   Advised to take 1 mg of folate supplement per day if capable of pregnancy.   Sexuality: Discussed sexually transmitted diseases, partner selection, use of condoms, avoidance of unintended pregnancy  and contraceptive alternatives.   Advised to avoid cigarette smoking.  I discussed with the patient that most people either abstain from alcohol or drink within safe limits (<=14/week and <=4 drinks/occasion for males, <=7/weeks and <= 3 drinks/occasion for females) and that the risk for alcohol disorders and other health effects rises proportionally with the number of drinks per week and how often a drinker exceeds daily limits.  Discussed cessation/primary prevention of drug use and availability of treatment for abuse.   Diet: Encouraged to adjust caloric intake to maintain  or achieve ideal body weight, to reduce intake of dietary saturated fat and total fat, to limit sodium intake by avoiding high sodium foods and not adding table salt, and to maintain adequate dietary potassium and calcium preferably from fresh fruits, vegetables, and low-fat dairy products.    stressed the importance of regular exercise  Injury prevention: Discussed safety belts, safety helmets, smoke detector, smoking near bedding or upholstery.   Dental health: Discussed importance of regular tooth brushing, flossing, and dental visits.    NEXT PREVENTATIVE PHYSICAL DUE IN 1 YEAR. Return in about 1 year (around 08/31/2018) for CPE.

## 2017-08-31 LAB — COMPREHENSIVE METABOLIC PANEL
A/G RATIO: 2.2 (ref 1.2–2.2)
ALT: 11 IU/L (ref 0–32)
AST: 17 IU/L (ref 0–40)
Albumin: 4.8 g/dL (ref 3.5–5.5)
Alkaline Phosphatase: 69 IU/L (ref 39–117)
BUN/Creatinine Ratio: 13 (ref 9–23)
BUN: 10 mg/dL (ref 6–20)
Bilirubin Total: 0.9 mg/dL (ref 0.0–1.2)
CALCIUM: 9.3 mg/dL (ref 8.7–10.2)
CO2: 24 mmol/L (ref 20–29)
Chloride: 104 mmol/L (ref 96–106)
Creatinine, Ser: 0.79 mg/dL (ref 0.57–1.00)
GFR, EST AFRICAN AMERICAN: 112 mL/min/{1.73_m2} (ref >59)
GFR, EST NON AFRICAN AMERICAN: 97 mL/min/{1.73_m2} (ref >59)
Globulin, Total: 2.2 g/dL (ref 1.5–4.5)
Glucose: 89 mg/dL (ref 65–99)
POTASSIUM: 3.7 mmol/L (ref 3.5–5.2)
SODIUM: 142 mmol/L (ref 134–144)
TOTAL PROTEIN: 7 g/dL (ref 6.0–8.5)

## 2017-08-31 LAB — LIPID PANEL W/O CHOL/HDL RATIO
Cholesterol, Total: 211 mg/dL — ABNORMAL HIGH (ref 100–199)
HDL: 82 mg/dL (ref >39)
LDL CALC: 115 mg/dL — AB (ref 0–99)
TRIGLYCERIDES: 71 mg/dL (ref 0–149)
VLDL Cholesterol Cal: 14 mg/dL (ref 5–40)

## 2017-08-31 LAB — CBC WITH DIFFERENTIAL/PLATELET
BASOS: 1 %
Basophils Absolute: 0 10*3/uL (ref 0.0–0.2)
EOS (ABSOLUTE): 0.1 10*3/uL (ref 0.0–0.4)
EOS: 2 %
HEMATOCRIT: 41.4 % (ref 34.0–46.6)
Hemoglobin: 13.5 g/dL (ref 11.1–15.9)
IMMATURE GRANS (ABS): 0 10*3/uL (ref 0.0–0.1)
IMMATURE GRANULOCYTES: 0 %
LYMPHS: 29 %
Lymphocytes Absolute: 1.3 10*3/uL (ref 0.7–3.1)
MCH: 29.4 pg (ref 26.6–33.0)
MCHC: 32.6 g/dL (ref 31.5–35.7)
MCV: 90 fL (ref 79–97)
MONOCYTES: 8 %
Monocytes Absolute: 0.4 10*3/uL (ref 0.1–0.9)
NEUTROS PCT: 60 %
Neutrophils Absolute: 2.7 10*3/uL (ref 1.4–7.0)
Platelets: 243 10*3/uL (ref 150–450)
RBC: 4.59 x10E6/uL (ref 3.77–5.28)
RDW: 13.3 % (ref 12.3–15.4)
WBC: 4.4 10*3/uL (ref 3.4–10.8)

## 2017-08-31 LAB — TSH: TSH: 1.5 u[IU]/mL (ref 0.450–4.500)

## 2017-09-01 LAB — URINE CULTURE

## 2017-09-02 NOTE — Patient Instructions (Signed)
Follow up in 1 year.

## 2017-09-04 ENCOUNTER — Encounter: Payer: Self-pay | Admitting: Family Medicine

## 2017-09-24 ENCOUNTER — Encounter: Payer: Self-pay | Admitting: Family Medicine

## 2017-09-24 ENCOUNTER — Ambulatory Visit (INDEPENDENT_AMBULATORY_CARE_PROVIDER_SITE_OTHER): Payer: BLUE CROSS/BLUE SHIELD | Admitting: Family Medicine

## 2017-09-24 VITALS — BP 109/64 | HR 86 | Temp 98.1°F | Ht 66.0 in | Wt 125.0 lb

## 2017-09-24 DIAGNOSIS — B309 Viral conjunctivitis, unspecified: Secondary | ICD-10-CM | POA: Diagnosis not present

## 2017-09-24 DIAGNOSIS — J0110 Acute frontal sinusitis, unspecified: Secondary | ICD-10-CM | POA: Diagnosis not present

## 2017-09-24 MED ORDER — POLYMYXIN B-TRIMETHOPRIM 10000-0.1 UNIT/ML-% OP SOLN: 1.0000 [drp] | Freq: Four times a day (QID) | OPHTHALMIC | 0 refills | Status: DC

## 2017-09-24 NOTE — Progress Notes (Signed)
BP 109/64 (BP Location: Right Arm, Patient Position: Sitting, Cuff Size: Normal)   Pulse 86   Temp 98.1 F (36.7 C) (Oral)   Ht 5\' 6"  (1.676 m)   Wt 125 lb (56.7 kg)   SpO2 98%   BMI 20.18 kg/m    Subjective:    Patient ID: Debbie Green, female    DOB: [birth year], 37 y.o.   MRN: [id number]  HPI: Debbie Green is a 37 y.o. female  Chief Complaint  Patient presents with  . Sinusitis    Ongoing for 2 weeks.   . Eye Problem    Patient's left eye very red and draining. Patient states it's painful if she bends over. Began last night.   2 weeks of persisting sinus pain and pressure, frontal headaches. Initially had drainage and congestion but that seems to have resolved. Tried a zyrtec and some flonase with no relief. Now having left eye discharge, redness, irritation since yesterday. Denies visual changes, fevers, N/V, eye pain.   Relevant past medical, surgical, family and social history reviewed and updated as indicated. Interim medical history since our last visit reviewed. Allergies and medications reviewed and updated.  Review of Systems  Per HPI unless specifically indicated above     Objective:    BP 109/64 (BP Location: Right Arm, Patient Position: Sitting, Cuff Size: Normal)   Pulse 86   Temp 98.1 F (36.7 C) (Oral)   Ht 5\' 6"  (1.676 m)   Wt 125 lb (56.7 kg)   SpO2 98%   BMI 20.18 kg/m   Wt Readings from Last 3 Encounters:  09/24/17 125 lb (56.7 kg)  08/30/17 123 lb 3 oz (55.9 kg)  11/16/16 123 lb (55.8 kg)    Physical Exam  Constitutional: She is oriented to person, place, and time. She appears well-developed and well-nourished. No distress.  HENT:  Head: Atraumatic.  Right Ear: External ear normal.  Left Ear: External ear normal.  Nose: Nose normal.  Mouth/Throat: Oropharynx is clear and moist.  Frontal sinuses ttp Nasal mucosa injected  Eyes: Pupils are equal, round, and reactive to light. EOM are normal. Right eye exhibits discharge  (minimal drainage beginning in right eye). Left eye exhibits discharge.  Left eye erythematous with drainage  Neck: Normal range of motion. Neck supple.  Cardiovascular: Normal rate and regular rhythm.  Pulmonary/Chest: Effort normal and breath sounds normal.  Musculoskeletal: Normal range of motion.  Neurological: She is alert and oriented to person, place, and time.  Skin: Skin is warm and dry.  Psychiatric: She has a normal mood and affect. Her behavior is normal.  Nursing note and vitals reviewed.   Results for orders placed or performed in visit on 08/30/17  Microscopic Examination  Result Value Ref Range   WBC, UA 11-30 (A) 0 - 5 /hpf   RBC, UA 0-2 0 - 2 /hpf   Epithelial Cells (non renal) 0-10 0 - 10 /hpf   Bacteria, UA Few None seen/Few  Urine Culture  Result Value Ref Range   Urine Culture, Routine Final report (A)    Organism ID, Bacteria Comment (A)    ORGANISM ID, BACTERIA Comment   CBC with Differential/Platelet  Result Value Ref Range   WBC 4.4 3.4 - 10.8 x10E3/uL   RBC 4.59 3.77 - 5.28 x10E6/uL   Hemoglobin 13.5 11.1 - 15.9 g/dL   Hematocrit 41.4 34.0 - 46.6 %   MCV 90 79 - 97 fL   MCH 29.4 26.6 - 33.0 pg  MCHC 32.6 31.5 - 35.7 g/dL   RDW 13.3 12.3 - 15.4 %   Platelets 243 150 - 450 x10E3/uL   Neutrophils 60 Not Estab. %   Lymphs 29 Not Estab. %   Monocytes 8 Not Estab. %   Eos 2 Not Estab. %   Basos 1 Not Estab. %   Neutrophils Absolute 2.7 1.4 - 7.0 x10E3/uL   Lymphocytes Absolute 1.3 0.7 - 3.1 x10E3/uL   Monocytes Absolute 0.4 0.1 - 0.9 x10E3/uL   EOS (ABSOLUTE) 0.1 0.0 - 0.4 x10E3/uL   Basophils Absolute 0.0 0.0 - 0.2 x10E3/uL   Immature Granulocytes 0 Not Estab. %   Immature Grans (Abs) 0.0 0.0 - 0.1 x10E3/uL  Comprehensive metabolic panel  Result Value Ref Range   Glucose 89 65 - 99 mg/dL   BUN 10 6 - 20 mg/dL   Creatinine, Ser 0.79 0.57 - 1.00 mg/dL   GFR calc non Af Amer 97 >59 mL/min/1.73   GFR calc Af Amer 112 >59 mL/min/1.73    BUN/Creatinine Ratio 13 9 - 23   Sodium 142 134 - 144 mmol/L   Potassium 3.7 3.5 - 5.2 mmol/L   Chloride 104 96 - 106 mmol/L   CO2 24 20 - 29 mmol/L   Calcium 9.3 8.7 - 10.2 mg/dL   Total Protein 7.0 6.0 - 8.5 g/dL   Albumin 4.8 3.5 - 5.5 g/dL   Globulin, Total 2.2 1.5 - 4.5 g/dL   Albumin/Globulin Ratio 2.2 1.2 - 2.2   Bilirubin Total 0.9 0.0 - 1.2 mg/dL   Alkaline Phosphatase 69 39 - 117 IU/L   AST 17 0 - 40 IU/L   ALT 11 0 - 32 IU/L  Lipid Panel w/o Chol/HDL Ratio  Result Value Ref Range   Cholesterol, Total 211 (H) 100 - 199 mg/dL   Triglycerides 71 0 - 149 mg/dL   HDL 82 >39 mg/dL   VLDL Cholesterol Cal 14 5 - 40 mg/dL   LDL Calculated 115 (H) 0 - 99 mg/dL  TSH  Result Value Ref Range   TSH 1.500 0.450 - 4.500 uIU/mL  UA/M w/rflx Culture, Routine  Result Value Ref Range   Specific Gravity, UA <1.005 (L) 1.005 - 1.030   pH, UA 6.5 5.0 - 7.5   Color, UA Straw Yellow   Appearance Ur Cloudy (A) Clear   Leukocytes, UA 2+ (A) Negative   Protein, UA Negative Negative/Trace   Glucose, UA Negative Negative   Ketones, UA Negative Negative   RBC, UA 2+ (A) Negative   Bilirubin, UA Negative Negative   Urobilinogen, Ur 0.2 0.2 - 1.0 mg/dL   Nitrite, UA Negative Negative   Microscopic Examination See below:       Assessment & Plan:   Problem List Items Addressed This Visit    None    Visit Diagnoses    Acute frontal sinusitis, recurrence not specified    -  Primary   Appears to be resolving, with just lingering facial pressure. Tx with daily zyrtec, flonase, and sudafed prn. F/u if no better   Acute viral conjunctivitis of left eye       Suspect viral as quickly spreading to right eye, but will start polytrim drops along with warm compresses in case bacterial       Follow up plan: Return if symptoms worsen or fail to improve.

## 2017-09-27 NOTE — Patient Instructions (Signed)
Follow up as needed

## 2017-09-28 ENCOUNTER — Encounter: Payer: Self-pay | Admitting: Family Medicine

## 2017-10-11 ENCOUNTER — Other Ambulatory Visit: Payer: Self-pay | Admitting: Family Medicine

## 2017-10-11 MED ORDER — PREDNISONE 20 MG PO TABS: 40.0000 mg | ORAL_TABLET | Freq: Every day | ORAL | 0 refills | Status: DC

## 2017-10-22 ENCOUNTER — Other Ambulatory Visit: Payer: Self-pay | Admitting: Family Medicine

## 2017-10-22 DIAGNOSIS — M542 Cervicalgia: Principal | ICD-10-CM

## 2017-10-22 DIAGNOSIS — G8929 Other chronic pain: Secondary | ICD-10-CM

## 2017-10-22 DIAGNOSIS — R51 Headache: Secondary | ICD-10-CM

## 2017-10-22 DIAGNOSIS — R519 Headache, unspecified: Secondary | ICD-10-CM

## 2017-10-22 MED ORDER — CYCLOBENZAPRINE HCL 10 MG PO TABS: 10.0000 mg | ORAL_TABLET | Freq: Three times a day (TID) | ORAL | 0 refills | Status: DC | PRN

## 2018-01-24 ENCOUNTER — Encounter: Payer: Self-pay | Admitting: Family Medicine

## 2018-01-24 MED ORDER — PREDNISONE 50 MG PO TABS: 50.0000 mg | ORAL_TABLET | Freq: Every day | ORAL | 0 refills | Status: DC

## 2018-04-26 ENCOUNTER — Ambulatory Visit (INDEPENDENT_AMBULATORY_CARE_PROVIDER_SITE_OTHER): Payer: BLUE CROSS/BLUE SHIELD | Admitting: Obstetrics & Gynecology

## 2018-04-26 ENCOUNTER — Other Ambulatory Visit: Payer: Self-pay

## 2018-04-26 ENCOUNTER — Encounter: Payer: Self-pay | Admitting: Obstetrics & Gynecology

## 2018-04-26 VITALS — BP 120/70 | HR 95 | Ht 66.0 in | Wt 128.0 lb

## 2018-04-26 DIAGNOSIS — Z01419 Encounter for gynecological examination (general) (routine) without abnormal findings: Secondary | ICD-10-CM | POA: Diagnosis not present

## 2018-04-26 NOTE — Progress Notes (Signed)
HPI:      [name] is a 38 y.o. G2P2 who LMP was Patient's last menstrual period was 04/15/2018 (exact date)., she presents today for her annual examination. The patient has no complaints today, other than DECREASE in LIBIDO-  Pt has had Mirena for 4 years since post partum vag delivery of her son; reg but light periods without pain although more cramping of recent note.  Also seems to have more leakage of urine w cough and laugh and exercise.  The patient is sexually active. Her last pap: approximate date 2018 and was normal. The patient does perform self breast exams.  There is no notable family history of breast or ovarian cancer in her family.  The patient has regular exercise: yes.  The patient denies current symptoms of depression.    GYN History: Contraception: IUD  PMHx: Past Medical History:  Diagnosis Date  . Anxiety   . Bleeding ulcer 2012   History reviewed. No pertinent surgical history. Family History  Problem Relation Age of Onset  . Anemia Mother   . Hyperlipidemia Father   . Hypertension Father   . Heart disease Maternal Grandmother   . Aneurysm Paternal Grandmother        Brain  . COPD Paternal Grandfather    Social History   Tobacco Use  . Smoking status: Never Smoker  . Smokeless tobacco: Never Used  Substance Use Topics  . Alcohol use: Yes    Alcohol/week: 7.0 standard drinks    Types: 7 Glasses of wine per week    Comment: social  . Drug use: No    Current Outpatient Medications:  .  fluocinolone (SYNALAR) 0.01 % external solution, Apply topically 2 (two) times daily., Disp: 90 mL, Rfl: 6 .  fluticasone (FLONASE) 50 MCG/ACT nasal spray, Place 2 sprays into both nostrils daily., Disp: 16 g, Rfl: 6 .  levonorgestrel (MIRENA) 20 MCG/24HR IUD, 1 each by Intrauterine route once., Disp: , Rfl:  Allergies: Nsaids  Review of Systems  Constitutional: Negative for chills, fever and malaise/fatigue.  HENT: Negative for congestion, sinus pain  and sore throat.   Eyes: Negative for blurred vision and pain.  Respiratory: Negative for cough and wheezing.   Cardiovascular: Negative for chest pain and leg swelling.  Gastrointestinal: Negative for abdominal pain, constipation, diarrhea, heartburn, nausea and vomiting.  Genitourinary: Negative for dysuria, frequency, hematuria and urgency.  Musculoskeletal: Negative for back pain, joint pain, myalgias and neck pain.  Skin: Negative for itching and rash.  Neurological: Negative for dizziness, tremors and weakness.  Endo/Heme/Allergies: Does not bruise/bleed easily.  Psychiatric/Behavioral: Negative for depression. The patient is not nervous/anxious and does not have insomnia.     Objective: BP 120/70   Pulse 95   Ht 5\' 6"  (1.676 m)   Wt 128 lb (58.1 kg)   LMP 04/15/2018 (Exact Date)   BMI 20.66 kg/m   Filed Weights   04/26/18 1422  Weight: 128 lb (58.1 kg)   Body mass index is 20.66 kg/m. Physical Exam Constitutional:      General: She is not in acute distress.    Appearance: She is well-developed.  Genitourinary:     Pelvic exam was performed with patient supine.     Vagina, uterus and rectum normal.     No lesions in the vagina.     No vaginal bleeding.     No cervical motion tenderness, friability, lesion or polyp.     Uterus is mobile.  Uterus is not enlarged.     No uterine mass detected.    Uterus is midaxial.     No right or left adnexal mass present.     Right adnexa not tender.     Left adnexa not tender.  HENT:     Head: Normocephalic and atraumatic. No laceration.     Right Ear: Hearing normal.     Left Ear: Hearing normal.     Mouth/Throat:     Pharynx: Uvula midline.  Eyes:     Pupils: Pupils are equal, round, and reactive to light.  Neck:     Musculoskeletal: Normal range of motion and neck supple.     Thyroid: No thyromegaly.  Cardiovascular:     Rate and Rhythm: Normal rate and regular rhythm.     Heart sounds: No murmur. No friction rub.  No gallop.   Pulmonary:     Effort: Pulmonary effort is normal. No respiratory distress.     Breath sounds: Normal breath sounds. No wheezing.  Chest:     Breasts:        Right: No mass, skin change or tenderness.        Left: No mass, skin change or tenderness.  Abdominal:     General: Bowel sounds are normal. There is no distension.     Palpations: Abdomen is soft.     Tenderness: There is no abdominal tenderness. There is no rebound.  Musculoskeletal: Normal range of motion.  Neurological:     Mental Status: She is alert and oriented to person, place, and time.     Cranial Nerves: No cranial nerve deficit.  Skin:    General: Skin is warm and dry.  Psychiatric:        Judgment: Judgment normal.  Vitals signs reviewed.     Assessment:  ANNUAL EXAM 1. Women's annual routine gynecological examination     Screening Plan:            1.  Cervical Screening-  Pap smear schedule reviewed with patient  2. Breast screening- Exam annually and mammogram>40 planned   3. Labs managed by PCP   4. GSI, monitor degree of sx's.  Poise pads, inserts.  Kegels.  5. Counseling for contraception: Pt will continue IUD until vasectomy completed, then desires to have it removed and no hormones used to see how periods and libido respond.  Prior SE OCPs (erythema nodosum)     F/U  Return in about 1 year (around 04/26/2019) for Annual.  Barnett Applebaum, MD, [facility], [facility] 04/26/2018  3:23 PM

## 2018-04-26 NOTE — Patient Instructions (Signed)
PAP every three years Labs every year or so (with PCP)

## 2018-05-14 ENCOUNTER — Telehealth: Payer: Self-pay | Admitting: Physician Assistant

## 2018-05-14 DIAGNOSIS — R509 Fever, unspecified: Secondary | ICD-10-CM

## 2018-05-14 NOTE — Progress Notes (Signed)
E-Visit for Corona Virus Screening  Based on your current symptoms, you may very well have the virus, however your symptoms are mild. Currently, not all patients are being tested. If the symptoms are mild and there is not a known exposure, performing the test is not indicated.  Coronavirus disease 2019 (COVID-19)is a respiratory illness that can spread from person to person. The virus that causes COVID-19 is a new virus that was first identified in the country of [place] but is now found in multiple other countries and has spread to the [place].  Symptoms associated with the virus are mild to severe fever, cough, and shortness of breath. There is currently no vaccine to protect against COVID-19, and there is no specific antiviral treatment for the virus.   To be considered HIGH RISK for Coronavirus (COVID-19), you have to meet the following criteria:  . Traveled to [place], [place], [place], [place] or [place]; or in the [place] to [place], [place], [place], or [place]; and have fever, cough, and shortness of breath within the last 2 weeks of travel OR  . Been in close contact with a person diagnosed with COVID-19 within the last 2 weeks and have fever, cough, and shortness of breath  . IF YOU DO NOT MEET THESE CRITERIA, YOU ARE CONSIDERED LOW RISK FOR COVID-19.   It is vitally important that if you feel that you have an infection such as this virus or any other virus that you stay home and away from places where you may spread it to others.  You should self-quarantine for 14 days if you have symptoms that could potentially be coronavirus and avoid contact with people age 70 and older.   You may also take acetaminophen (Tylenol) as needed for fever.   Reduce your risk of any infection by using the same precautions used for avoiding the common cold or flu: Wash your hands often with soap and warm water for at least 20 seconds.  If soap and water are not readily available, use an  alcohol-based hand sanitizer with at least 60% alcohol.  If coughing or sneezing, cover your mouth and nose by coughing or sneezing into the elbow areas of your shirt or coat, into a tissue or into your sleeve (not your hands). Avoid shaking hands with others and consider head nods or verbal greetings only.  Avoid touching your eyes,nose, or mouth with unwashed hands. Avoid close contact with people who are sick. Avoid places or events with large numbers of people in one location, like concerts or sporting events. Carefully consider travel plans you have or are making. If you are planning any travel outside or inside the [place], visit the CDC'sTravelers' Health webpagefor the latest health notices. If you have some symptoms but not all symptoms, continue to monitor at home and seek medical attention if your symptoms worsen. If you are having a medical emergency, call 911.  HOME CARE Only take medications as instructed by your medical team. Drink plenty of fluids and get plenty of rest. A steam or ultrasonic humidifier can help if you have congestion.   GET HELP RIGHT AWAY IF: You develop worsening fever. You become short of breath You cough up blood. Your symptoms become more severe MAKE SURE YOU  Understand these instructions. Will watch your condition. Will get help right away if you are not doing well or get worse.  Your e-visit answers were reviewed by a board certified advanced clinical practitioner to complete your personal  care plan.  Depending on the condition, your plan could have included both over the counter or prescription medications.  If there is a problem please reply once you have received a response from your provider. Your safety is important to [place].  If you have drug allergies check your prescription carefully.    You can use MyChart to ask questions about today's visit, request a non-urgent call back, or ask for a work or school excuse for 24 hours related to this  e-Visit. If it has been greater than 24 hours you will need to follow up with your provider, or enter a new e-Visit to address those concerns. You will get an e-mail in the next two days asking about your experience.  I hope that your e-visit has been valuable and will speed your recovery. Thank you for using e-visits.    ===View-only below this line===   ----- Message -----    From: [name]    Sent: 05/14/2018 10:28 AM EDT      To: E-Visit Mailing List Subject: E-Visit Submission: CoronaVirus ([id number]) Screening  E-Visit Submission: CoronaVirus ([id number]) Screening --------------------------------  Question: Do you have any of the following?  Answer:   Cough            Fever  Question: Do you have any of the following additional symptoms?  Answer:   Sore throat            Stuffy nose            Body aches  Question: Have you had a fever? Answer:   Yes  Question: If you are running a fever, please type in your temperature reading Answer:   101  Question: How long have you had the fever? Answer:   Just today  Question: Have others in your home or workplace had similar symptoms? Answer:   No  Question: When did your symptoms start? Answer:   Saturday - sneeze and congested cough. Like allergies.  Question: Have you recently visited any of the following countries? Answer:   None of these  Question: If you have traveled anywhere in the last  2 months please document where you have visited: Answer:   [place], [place]  Question: Have you recently been around others from these countries or visited these countries who have had coughing or fever? Answer:   No  Question: Have you recently been around anyone who has been diagnosed with Corona virus? Answer:   No  Question: Have you been taking any medications? Answer:   Yes  Question: If taking medications for these symptoms, please list the names and whether they are helping or not Answer:   Took Cetrizine this  morning, Flonase. Maybe.  Question: Are you treated for any of the following conditions: Asthma, COPD, Diabetes, Renal Failure (on Dialysis), AIDS, any Neuromuscular disease that effects the clearing of secretions, Heart Failure, or Heart Disease? Answer:   No  Question: Please enter a phone number where you can be reached if we have additional questions about your symptoms Answer:   [phone]  Question: Please list your medication allergies that you may have ? (If 'none' , please list as 'none') Answer:   None  Question: Please list any additional comments  Answer:   Chills this morning made me think this was more than allergies and prompted me to take temp.  Question: Are you pregnant? Answer:   I am confident that I am not pregnant  Question: Are you breastfeeding?  Answer:   No  A total of 5-10 minutes was spent evaluating this patients questionnaire and formulating a plan of care.

## 2018-11-01 ENCOUNTER — Telehealth: Payer: Self-pay | Admitting: Physician Assistant

## 2018-11-01 DIAGNOSIS — J069 Acute upper respiratory infection, unspecified: Secondary | ICD-10-CM

## 2018-11-01 NOTE — Progress Notes (Signed)
We are sorry you are not feeling well.  Here is how we plan to help!  Based on what you have shared with me, it looks like you may have a viral upper respiratory infection.    Upper respiratory infections are caused by a large number of viruses; however, rhinovirus is the most common cause. I understand the concern over if this could be COVID or not. It can be difficult sometimes to tell the difference between COVID and other Viral URI's but I have lower suspicion that this is COVID giving the symptoms you and your son have had. I do think it is reasonable to return to work after your symptoms improve. If your workplace is requiring testing for any respiratory infections we do have testing centers in the community you can utilize including one at [place] can find more online at [email or web address] as needed.   Symptoms vary from person to person, with common symptoms including sore throat, cough, fatigue or lack of energy and feeling of general discomfort.  A low-grade fever of up to 100.4 may present, but is often uncommon.  Symptoms vary however, and are closely related to a person's age or underlying illnesses.  The most common symptoms associated with an upper respiratory infection are nasal discharge or congestion, cough, sneezing, headache and pressure in the ears and face.  These symptoms usually persist for about 3 to 10 days, but can last up to 2 weeks.  It is important to know that upper respiratory infections do not cause serious illness or complications in most cases.    Upper respiratory infections can be transmitted from person to person, with the most common method of transmission being a person's hands.  The virus is able to live on the skin and can infect other persons for up to 2 hours after direct contact.  Also, these can be transmitted when someone coughs or sneezes; thus, it is important to cover the mouth to reduce this risk.  To keep the spread of the illness at [place],  good hand hygiene is very important.  This is an infection that is most likely caused by a virus. There are no specific treatments other than to help you with the symptoms until the infection runs its course.  We are sorry you are not feeling well.  Here is how we plan to help!   For nasal congestion, you may use an oral decongestants such as Mucinex D or if you have glaucoma or high blood pressure use plain Mucinex.  Saline nasal spray or nasal drops can help and can safely be used as often as needed for congestion.  If you do not have a history of heart disease, hypertension, diabetes or thyroid disease, prostate/bladder issues or glaucoma, you may also use Sudafed to treat nasal congestion.  It is highly recommended that you consult with a pharmacist or your primary care physician to ensure this medication is safe for you to take.     If you have a cough, you may use cough suppressants such as Delsym and Robitussin.  If you have glaucoma or high blood pressure, you can also use Coricidin HBP.     If you have a sore or scratchy throat, use a saltwater gargle-  to  teaspoon of salt dissolved in a 4-ounce to 8-ounce glass of warm water.  Gargle the solution for approximately 15-30 seconds and then spit.  It is important not to swallow the solution.  You can also use throat  lozenges/cough drops and Chloraseptic spray to help with throat pain or discomfort.  Warm or cold liquids can also be helpful in relieving throat pain.  For headache, pain or general discomfort, you can use Ibuprofen or Tylenol as directed.   Some authorities believe that zinc sprays or the use of Echinacea may shorten the course of your symptoms.   HOME CARE . Only take medications as instructed by your medical team. . Be sure to drink plenty of fluids. Water is fine as well as fruit juices, sodas and electrolyte beverages. You may want to stay away from caffeine or alcohol. If you are nauseated, try taking small sips of  liquids. How do you know if you are getting enough fluid? Your urine should be a pale yellow or almost colorless. . Get rest. . Taking a steamy shower or using a humidifier may help nasal congestion and ease sore throat pain. You can place a towel over your head and breathe in the steam from hot water coming from a faucet. . Using a saline nasal spray works much the same way. . Cough drops, hard candies and sore throat lozenges may ease your cough. . Avoid close contacts especially the very young and the elderly . Cover your mouth if you cough or sneeze . Always remember to wash your hands.   GET HELP RIGHT AWAY IF: . You develop worsening fever. . If your symptoms do not improve within 10 days . You develop yellow or green discharge from your nose over 3 days. . You have coughing fits . You develop a severe head ache or visual changes. . You develop shortness of breath, difficulty breathing or start having chest pain . Your symptoms persist after you have completed your treatment plan  MAKE SURE YOU   Understand these instructions.  Will watch your condition.  Will get help right away if you are not doing well or get worse.  Your e-visit answers were reviewed by a board certified advanced clinical practitioner to complete your personal care plan. Depending upon the condition, your plan could have included both over the counter or prescription medications. Please review your pharmacy choice. If there is a problem, you may call our nursing hot line at and have the prescription routed to another pharmacy. Your safety is important to [place]. If you have drug allergies check your prescription carefully.   You can use MyChart to ask questions about today's visit, request a non-urgent call back, or ask for a work or school excuse for 24 hours related to this e-Visit. If it has been greater than 24 hours you will need to follow up with your provider, or enter a new e-Visit to address those  concerns. You will get an e-mail in the next two days asking about your experience.  I hope that your e-visit has been valuable and will speed your recovery. Thank you for using e-visits.  Greater than 5 minutes, yet less than 10 minutes of time have been spent researching, coordinating, and implementing care for this patient today

## 2018-11-14 ENCOUNTER — Other Ambulatory Visit: Payer: Self-pay

## 2018-11-14 ENCOUNTER — Ambulatory Visit (INDEPENDENT_AMBULATORY_CARE_PROVIDER_SITE_OTHER): Payer: BC Managed Care – PPO | Admitting: Family Medicine

## 2018-11-14 ENCOUNTER — Encounter: Payer: Self-pay | Admitting: Family Medicine

## 2018-11-14 VITALS — BP 107/61 | HR 95 | Temp 98.7°F | Ht 66.0 in | Wt 123.0 lb

## 2018-11-14 DIAGNOSIS — Z23 Encounter for immunization: Secondary | ICD-10-CM | POA: Diagnosis not present

## 2018-11-14 DIAGNOSIS — R221 Localized swelling, mass and lump, neck: Secondary | ICD-10-CM | POA: Diagnosis not present

## 2018-11-14 DIAGNOSIS — R0989 Other specified symptoms and signs involving the circulatory and respiratory systems: Secondary | ICD-10-CM

## 2018-11-14 DIAGNOSIS — R198 Other specified symptoms and signs involving the digestive system and abdomen: Secondary | ICD-10-CM

## 2018-11-14 MED ORDER — CYCLOBENZAPRINE HCL 5 MG PO TABS: 5.0000 mg | ORAL_TABLET | Freq: Every evening | ORAL | 1 refills | Status: DC | PRN

## 2018-11-14 NOTE — Progress Notes (Signed)
BP 107/61   Pulse 95   Temp 98.7 F (37.1 C) (Oral)   Ht 5\' 6"  (1.676 m)   Wt 123 lb (55.8 kg)   SpO2 100%   BMI 19.85 kg/m    Subjective:    Patient ID: Debbie Green, female    DOB: October 11, 1980, 38 y.o.   MRN: [id number]  HPI: Debbie Green is a 38 y.o. female  Chief Complaint  Patient presents with  . Sore Throat    pt states has had thightnes on her throat x about 4 weeks   Here today for throat tightness, sometimes b/l and sometimes only on the left that shes noticed intermittently the past month. Has had episodic short-lived episodes in the past, always seeming to be during particularly stressful moments. 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 Significant stress during work the past month. Denies hx of thyroid issues, reflux sxs, dysphagia, difficulty breathing.   Relevant past medical, surgical, family and social history reviewed and updated as indicated. Interim medical history since our last visit reviewed. Allergies and medications reviewed and updated.  Review of Systems  Per HPI unless specifically indicated above     Objective:    BP 107/61   Pulse 95   Temp 98.7 F (37.1 C) (Oral)   Ht 5\' 6"  (1.676 m)   Wt 123 lb (55.8 kg)   SpO2 100%   BMI 19.85 kg/m   Wt Readings from Last 3 Encounters:  11/14/18 123 lb (55.8 kg)  04/26/18 128 lb (58.1 kg)  09/24/17 125 lb (56.7 kg)    Physical Exam Vitals signs and nursing note reviewed.  Constitutional:      Appearance: Normal appearance. She is not ill-appearing.  HENT:     Head: Atraumatic.  Eyes:     Extraocular Movements: Extraocular movements intact.     Conjunctiva/sclera: Conjunctivae normal.  Neck:     Musculoskeletal: Normal range of motion and neck supple. No muscular tenderness.     Comments: No thyromegaly  Cardiovascular:     Rate and Rhythm: Normal rate and regular rhythm.     Heart sounds: Normal heart sounds.  Pulmonary:     Effort: Pulmonary effort is normal.     Breath sounds: Normal breath sounds.  Musculoskeletal: Normal range of motion.  Lymphadenopathy:     Cervical: No cervical adenopathy.  Skin:    General: Skin is warm and dry.  Neurological:     Mental Status: She is alert and oriented to person, place, and time.  Psychiatric:        Mood and Affect: Mood normal.        Thought Content: Thought content normal.        Judgment: Judgment normal.     Results for orders placed or performed in visit on 11/14/18  CBC with Differential/Platelet  Result Value Ref Range   WBC 5.6 3.4 - 10.8 x10E3/uL   RBC 4.65 3.77 - 5.28 x10E6/uL   Hemoglobin 14.1 11.1 - 15.9 g/dL   Hematocrit 42.2 34.0 - 46.6 %   MCV 91 79 - 97 fL   MCH 30.3 26.6 - 33.0 pg   MCHC 33.4 31.5 - 35.7 g/dL   RDW 12.3 11.7 - 15.4 %   Platelets 254 150 - 450 x10E3/uL  Neutrophils 68 Not Estab. %   Lymphs 23 Not Estab. %   Monocytes 7 Not Estab. %   Eos 1 Not Estab. %   Basos 1 Not Estab. %   Neutrophils Absolute 3.8 1.4 - 7.0 x10E3/uL   Lymphocytes Absolute 1.3 0.7 - 3.1 x10E3/uL   Monocytes Absolute 0.4 0.1 - 0.9 x10E3/uL   EOS (ABSOLUTE) 0.1 0.0 - 0.4 x10E3/uL   Basophils Absolute 0.1 0.0 - 0.2 x10E3/uL   Immature Granulocytes 0 Not Estab. %   Immature Grans (Abs) 0.0 0.0 - 0.1 x10E3/uL  TSH  Result Value Ref Range   TSH 1.490 0.450 - 4.500 uIU/mL      Assessment & Plan:   Problem List Items Addressed This Visit    None    Visit Diagnoses    Globus sensation    -  Primary   Suspect anxiety-related vs muscle tension. Continue chiropractic, flexeril prn. Declines prn anxiety med and will instead try essential oils, meditation, etc   Relevant Orders   CBC with Differential/Platelet (Completed)   TSH (Completed)   Neck fullness       Will await labs and monitor for benefit from therapies  tried. F/u if worsening or not improving   Flu vaccine need       Relevant Orders   Flu Vaccine QUAD 36+ mos IM (Completed)       Follow up plan: Return if symptoms worsen or fail to improve.

## 2018-11-15 LAB — TSH: TSH: 1.49 u[IU]/mL (ref 0.450–4.500)

## 2018-11-15 LAB — CBC WITH DIFFERENTIAL/PLATELET
Basophils Absolute: 0.1 10*3/uL (ref 0.0–0.2)
Basos: 1 %
EOS (ABSOLUTE): 0.1 10*3/uL (ref 0.0–0.4)
Eos: 1 %
Hematocrit: 42.2 % (ref 34.0–46.6)
Hemoglobin: 14.1 g/dL (ref 11.1–15.9)
Immature Grans (Abs): 0 10*3/uL (ref 0.0–0.1)
Immature Granulocytes: 0 %
Lymphocytes Absolute: 1.3 10*3/uL (ref 0.7–3.1)
Lymphs: 23 %
MCH: 30.3 pg (ref 26.6–33.0)
MCHC: 33.4 g/dL (ref 31.5–35.7)
MCV: 91 fL (ref 79–97)
Monocytes Absolute: 0.4 10*3/uL (ref 0.1–0.9)
Monocytes: 7 %
Neutrophils Absolute: 3.8 10*3/uL (ref 1.4–7.0)
Neutrophils: 68 %
Platelets: 254 10*3/uL (ref 150–450)
RBC: 4.65 x10E6/uL (ref 3.77–5.28)
RDW: 12.3 % (ref 11.7–15.4)
WBC: 5.6 10*3/uL (ref 3.4–10.8)

## 2019-01-14 DIAGNOSIS — C439 Malignant melanoma of skin, unspecified: Secondary | ICD-10-CM

## 2019-01-14 HISTORY — DX: Malignant melanoma of skin, unspecified: C43.9

## 2019-02-03 DIAGNOSIS — C439 Malignant melanoma of skin, unspecified: Secondary | ICD-10-CM | POA: Insufficient documentation

## 2019-02-11 ENCOUNTER — Encounter: Payer: Self-pay | Admitting: Family Medicine

## 2019-09-22 ENCOUNTER — Encounter: Payer: Self-pay | Admitting: Family Medicine

## 2019-12-01 ENCOUNTER — Telehealth (INDEPENDENT_AMBULATORY_CARE_PROVIDER_SITE_OTHER): Payer: BC Managed Care – PPO | Admitting: Family Medicine

## 2019-12-01 ENCOUNTER — Encounter: Payer: Self-pay | Admitting: Family Medicine

## 2019-12-01 VITALS — HR 62

## 2019-12-01 DIAGNOSIS — R059 Cough, unspecified: Secondary | ICD-10-CM | POA: Diagnosis not present

## 2019-12-01 MED ORDER — HYDROCOD POLST-CPM POLST ER 10-8 MG/5ML PO SUER: 5.0000 mL | Freq: Every evening | ORAL | 0 refills | Status: DC | PRN

## 2019-12-01 MED ORDER — ALBUTEROL SULFATE HFA 108 (90 BASE) MCG/ACT IN AERS: 2.0000 | INHALATION_SPRAY | Freq: Four times a day (QID) | RESPIRATORY_TRACT | 3 refills | Status: DC | PRN

## 2019-12-01 MED ORDER — PREDNISONE 50 MG PO TABS: 50.0000 mg | ORAL_TABLET | Freq: Every day | ORAL | 0 refills | Status: DC

## 2019-12-01 MED ORDER — FLUTICASONE PROPIONATE 50 MCG/ACT NA SUSP: 2.0000 | Freq: Every day | NASAL | 6 refills | Status: DC

## 2019-12-01 NOTE — Progress Notes (Signed)
Pulse 62    Subjective:    Patient ID: Debbie Green, female    DOB: 03/22/1980, 39 y.o.   MRN: [id number]  HPI: [name] is a 39 y.o. female  Chief Complaint  Patient presents with  . URI    pt states she has had congestion and a cough for the past 4 weeks. States she had a cold at the end of September and is still having these symptoms. States she tested negative for COVID   UPPER RESPIRATORY TRACT INFECTION Duration: 3 weeks Worst symptom: cough Fever: no Cough: yes Shortness of breath: no Wheezing: no Chest pain: no Chest tightness: no Chest congestion: no Nasal congestion: yes Runny nose: no Post nasal drip: no Sneezing: no Sore throat: no Swollen glands: no Sinus pressure: no Headache: no Face pain: no Toothache: no Ear pain: no  Ear pressure: no  Eyes red/itching:no Eye drainage/crusting: no  Vomiting: no Rash: no Fatigue: yes Sick contacts: yes Strep contacts: no  Context: stable Recurrent sinusitis: no Relief with OTC cold/cough medications: no  Treatments attempted: flonase  Relevant past medical, surgical, family and social history reviewed and updated as indicated. Interim medical history since our last visit reviewed. Allergies and medications reviewed and updated.  Review of Systems  Constitutional: Negative.   HENT: Positive for congestion. Negative for dental problem, drooling, ear discharge, ear pain, facial swelling, hearing loss, mouth sores, nosebleeds, postnasal drip, rhinorrhea, sinus pressure, sinus pain, sneezing, sore throat, tinnitus, trouble swallowing and voice change.   Cardiovascular: Negative.   Gastrointestinal: Negative.   Psychiatric/Behavioral: Negative.     Per HPI unless specifically indicated above     Objective:    Pulse 62   Wt Readings from Last 3 Encounters:  11/14/18 123 lb (55.8 kg)  04/26/18 128 lb (58.1 kg)  09/24/17 125 lb (56.7 kg)    Physical Exam Vitals and nursing note  reviewed.  Pulmonary:     Effort: Pulmonary effort is normal. No respiratory distress.     Comments: Speaking in full sentences Neurological:     Mental Status: She is alert.  Psychiatric:        Mood and Affect: Mood normal.        Behavior: Behavior normal.        Thought Content: Thought content normal.        Judgment: Judgment normal.     Results for orders placed or performed in visit on 11/14/18  CBC with Differential/Platelet  Result Value Ref Range   WBC 5.6 3.4 - 10.8 x10E3/uL   RBC 4.65 3.77 - 5.28 x10E6/uL   Hemoglobin 14.1 11.1 - 15.9 g/dL   Hematocrit 42.2 34.0 - 46.6 %   MCV 91 79 - 97 fL   MCH 30.3 26.6 - 33.0 pg   MCHC 33.4 31 - 35 g/dL   RDW 12.3 11.7 - 15.4 %   Platelets 254 150 - 450 x10E3/uL   Neutrophils 68 Not Estab. %   Lymphs 23 Not Estab. %   Monocytes 7 Not Estab. %   Eos 1 Not Estab. %   Basos 1 Not Estab. %   Neutrophils Absolute 3.8 1 - 7 x10E3/uL   Lymphocytes Absolute 1.3 0 - 3 x10E3/uL   Monocytes Absolute 0.4 0 - 0 x10E3/uL   EOS (ABSOLUTE) 0.1 0.0 - 0.4 x10E3/uL   Basophils Absolute 0.1 0 - 0 x10E3/uL   Immature Granulocytes 0 Not Estab. %   Immature Grans (Abs) 0.0 0.0 - 0.1  x10E3/uL  TSH  Result Value Ref Range   TSH 1.490 0.450 - 4.500 uIU/mL      Assessment & Plan:   Problem List Items Addressed This Visit    None    Visit Diagnoses    Cough    -  Primary   COVID negative. Will treat with prednisone. Albuterol, flonase and tussionex for comfort. Call if not getting better or getting worse. Continue to monitor.        Follow up plan: Return if symptoms worsen or fail to improve.    . This visit was completed via telephone due to the restrictions of the COVID-19 pandemic. All issues as above were discussed and addressed but no physical exam was performed. If it was felt that the patient should be evaluated in the office, they were directed there. The patient verbally consented to this visit. Patient was unable to complete  an audio/visual visit due to Technical difficulties. Due to the catastrophic nature of the COVID-19 pandemic, this visit was done through audio contact only. . Location of the patient: home . Location of the provider: work . Those involved with this call:  . Provider: Park Liter, DO . CMA: Yvonna Alanis, [place] . Front Desk/Registration: Jill Side  . Time spent on call: 21 minutes on the phone discussing health concerns. 23 minutes total spent in review of patient's record and preparation of their chart.

## 2019-12-11 ENCOUNTER — Encounter: Payer: BC Managed Care – PPO | Admitting: Obstetrics and Gynecology

## 2020-01-26 NOTE — Patient Instructions (Addendum)
Preventive Care 21-39 Years Old, Female Preventive care refers to visits with your health care provider and lifestyle choices that can promote health and wellness. This includes:  A yearly physical exam. This may also be called an annual well check.  Regular dental visits and eye exams.  Immunizations.  Screening for certain conditions.  Healthy lifestyle choices, such as eating a healthy diet, getting regular exercise, not using drugs or products that contain nicotine and tobacco, and limiting alcohol use. What can I expect for my preventive care visit? Physical exam Your health care provider will check your:  Height and weight. This may be used to calculate body mass index (BMI), which tells if you are at a healthy weight.  Heart rate and blood pressure.  Skin for abnormal spots. Counseling Your health care provider may ask you questions about your:  Alcohol, tobacco, and drug use.  Emotional well-being.  Home and relationship well-being.  Sexual activity.  Eating habits.  Work and work environment.  Method of birth control.  Menstrual cycle.  Pregnancy history. What immunizations do I need?  Influenza (flu) vaccine  This is recommended every year. Tetanus, diphtheria, and pertussis (Tdap) vaccine  You may need a Td booster every 10 years. Varicella (chickenpox) vaccine  You may need this if you have not been vaccinated. Human papillomavirus (HPV) vaccine  If recommended by your health care provider, you may need three doses over 6 months. Measles, mumps, and rubella (MMR) vaccine  You may need at least one dose of MMR. You may also need a second dose. Meningococcal conjugate (MenACWY) vaccine  One dose is recommended if you are age 19-21 years and a first-year college student living in a residence hall, or if you have one of several medical conditions. You may also need additional booster doses. Pneumococcal conjugate (PCV13) vaccine  You may need  this if you have certain conditions and were not previously vaccinated. Pneumococcal polysaccharide (PPSV23) vaccine  You may need one or two doses if you smoke cigarettes or if you have certain conditions. Hepatitis A vaccine  You may need this if you have certain conditions or if you travel or work in places where you may be exposed to hepatitis A. Hepatitis B vaccine  You may need this if you have certain conditions or if you travel or work in places where you may be exposed to hepatitis B. Haemophilus influenzae type b (Hib) vaccine  You may need this if you have certain conditions. You may receive vaccines as individual doses or as more than one vaccine together in one shot (combination vaccines). Talk with your health care provider about the risks and benefits of combination vaccines. What tests do I need?  Blood tests  Lipid and cholesterol levels. These may be checked every 5 years starting at age 20.  Hepatitis C test.  Hepatitis B test. Screening  Diabetes screening. This is done by checking your blood sugar (glucose) after you have not eaten for a while (fasting).  Sexually transmitted disease (STD) testing.  BRCA-related cancer screening. This may be done if you have a family history of breast, ovarian, tubal, or peritoneal cancers.  Pelvic exam and Pap test. This may be done every 3 years starting at age 21. Starting at age 30, this may be done every 5 years if you have a Pap test in combination with an HPV test. Talk with your health care provider about your test results, treatment options, and if necessary, the need for more tests.   Follow these instructions at home: Eating and drinking   Eat a diet that includes fresh fruits and vegetables, whole grains, lean protein, and low-fat dairy.  Take vitamin and mineral supplements as recommended by your health care provider.  Do not drink alcohol if: ? Your health care provider tells you not to drink. ? You are  pregnant, may be pregnant, or are planning to become pregnant.  If you drink alcohol: ? Limit how much you have to 0-1 drink a day. ? Be aware of how much alcohol is in your drink. In the [place] one drink equals one 12 oz bottle of beer (355 mL), one 5 oz glass of wine (148 mL), or one 1 oz glass of hard liquor (44 mL). Lifestyle  Take daily care of your teeth and gums.  Stay active. Exercise for at least 30 minutes on 5 or more days each week.  Do not use any products that contain nicotine or tobacco, such as cigarettes, e-cigarettes, and chewing tobacco. If you need help quitting, ask your health care provider.  If you are sexually active, practice safe sex. Use a condom or other form of birth control (contraception) in order to prevent pregnancy and STIs (sexually transmitted infections). If you plan to become pregnant, see your health care provider for a preconception visit. What's next?  Visit your health care provider once a year for a well check visit.  Ask your health care provider how often you should have your eyes and teeth checked.  Stay up to date on all vaccines. This information is not intended to replace advice given to you by your health care provider. Make sure you discuss any questions you have with your health care provider. Document Revised: 10/11/2017 Document Reviewed: 10/11/2017 Elsevier Patient Education  2020 Elsevier Inc. Breast Self-Awareness Breast self-awareness is knowing how your breasts look and feel. Doing breast self-awareness is important. It allows you to catch a breast problem early while it is still small and can be treated. All women should do breast self-awareness, including women who have had breast implants. Tell your doctor if you notice a change in your breasts. What you need:  A mirror.  A well-lit room. How to do a breast self-exam A breast self-exam is one way to learn what is normal for your breasts and to check for changes. To do a  breast self-exam: Look for changes  1. Take off all the clothes above your waist. 2. Stand in front of a mirror in a room with good lighting. 3. Put your hands on your hips. 4. Push your hands down. 5. Look at your breasts and nipples in the mirror to see if one breast or nipple looks different from the other. Check to see if: ? The shape of one breast is different. ? The size of one breast is different. ? There are wrinkles, dips, and bumps in one breast and not the other. 6. Look at each breast for changes in the skin, such as: ? Redness. ? Scaly areas. 7. Look for changes in your nipples, such as: ? Liquid around the nipples. ? Bleeding. ? Dimpling. ? Redness. ? A change in where the nipples are. Feel for changes  1. Lie on your back on the floor. 2. Feel each breast. To do this, follow these steps: ? Pick a breast to feel. ? Put the arm closest to that breast above your head. ? Use your other arm to feel the nipple area of your breast. Feel   the area with the pads of your three middle fingers by making small circles with your fingers. For the first circle, press lightly. For the second circle, press harder. For the third circle, press even harder. ? Keep making circles with your fingers at the different pressures as you move down your breast. Stop when you feel your ribs. ? Move your fingers a little toward the center of your body. ? Start making circles with your fingers again, this time going up until you reach your collarbone. ? Keep making up-and-down circles until you reach your armpit. Remember to keep using the three pressures. ? Feel the other breast in the same way. 3. Sit or stand in the tub or shower. 4. With soapy water on your skin, feel each breast the same way you did in step 2 when you were lying on the floor. Write down what you find Writing down what you find can help you remember what to tell your doctor. Write down:  What is normal for each breast.  Any  changes you find in each breast, including: ? The kind of changes you find. ? Whether you have pain. ? Size and location of any lumps.  When you last had your menstrual period. General tips  Check your breasts every month.  If you are breastfeeding, the best time to check your breasts is after you feed your baby or after you use a breast pump.  If you get menstrual periods, the best time to check your breasts is 5-7 days after your menstrual period is over.  With time, you will become comfortable with the self-exam, and you will begin to know if there are changes in your breasts. Contact a doctor if you:  See a change in the shape or size of your breasts or nipples.  See a change in the skin of your breast or nipples, such as red or scaly skin.  Have fluid coming from your nipples that is not normal.  Find a lump or thick area that was not there before.  Have pain in your breasts.  Have any concerns about your breast health. Summary  Breast self-awareness includes looking for changes in your breasts, as well as feeling for changes within your breasts.  Breast self-awareness should be done in front of a mirror in a well-lit room.  You should check your breasts every month. If you get menstrual periods, the best time to check your breasts is 5-7 days after your menstrual period is over.  Let your doctor know of any changes you see in your breasts, including changes in size, changes on the skin, pain or tenderness, or fluid from your nipples that is not normal. This information is not intended to replace advice given to you by your health care provider. Make sure you discuss any questions you have with your health care provider. Document Revised: 09/18/2017 Document Reviewed: 09/18/2017 Elsevier Patient Education  [place]. Influenza (Flu) Vaccine (Inactivated or Recombinant): What You Need to Know 1. Why get vaccinated? Influenza vaccine can prevent influenza  (flu). Flu is a contagious disease that spreads around the [place] every year, usually between October and May. Anyone can get the flu, but it is more dangerous for some people. Infants and young children, people 78 years of age and older, pregnant women, and people with certain health conditions or a weakened immune system are at greatest risk of flu complications. Pneumonia, bronchitis, sinus infections and ear infections are examples of flu-related complications.  If you have a medical condition, such as heart disease, cancer or diabetes, flu can make it worse. Flu can cause fever and chills, sore throat, muscle aches, fatigue, cough, headache, and runny or stuffy nose. Some people may have vomiting and diarrhea, though this is more common in children than adults. Each year thousands of people in the [place] die from flu, and many more are hospitalized. Flu vaccine prevents millions of illnesses and flu-related visits to the doctor each year. 2. Influenza vaccine CDC recommends everyone 69 months of age and older get vaccinated every flu season. Children 6 months through 45 years of age may need 2 doses during a single flu season. Everyone else needs only 1 dose each flu season. It takes about 2 weeks for protection to develop after vaccination. There are many flu viruses, and they are always changing. Each year a new flu vaccine is made to protect against three or four viruses that are likely to cause disease in the upcoming flu season. Even when the vaccine doesn't exactly match these viruses, it may still provide some protection. Influenza vaccine does not cause flu. Influenza vaccine may be given at the same time as other vaccines. 3. Talk with your health care provider Tell your vaccine provider if the person getting the vaccine:  Has had an allergic reaction after a previous dose of influenza vaccine, or has any severe, life-threatening allergies.  Has ever had Guillain-Barr  Syndrome (also called GBS). In some cases, your health care provider may decide to postpone influenza vaccination to a future visit. People with minor illnesses, such as a cold, may be vaccinated. People who are moderately or severely ill should usually wait until they recover before getting influenza vaccine. Your health care provider can give you more information. 4. Risks of a vaccine reaction  Soreness, redness, and swelling where shot is given, fever, muscle aches, and headache can happen after influenza vaccine.  There may be a very small increased risk of Guillain-Barr Syndrome (GBS) after inactivated influenza vaccine (the flu shot). Young children who get the flu shot along with pneumococcal vaccine (PCV13), and/or DTaP vaccine at the same time might be slightly more likely to have a seizure caused by fever. Tell your health care provider if a child who is getting flu vaccine has ever had a seizure. People sometimes faint after medical procedures, including vaccination. Tell your provider if you feel dizzy or have vision changes or ringing in the ears. As with any medicine, there is a very remote chance of a vaccine causing a severe allergic reaction, other serious injury, or death. 5. What if there is a serious problem? An allergic reaction could occur after the vaccinated person leaves the clinic. If you see signs of a severe allergic reaction (hives, swelling of the face and throat, difficulty breathing, a fast heartbeat, dizziness, or weakness), call 9-1-1 and get the person to the nearest hospital. For other signs that concern you, call your health care provider. Adverse reactions should be reported to the Vaccine Adverse Event Reporting System (VAERS). Your health care provider will usually file this report, or you can do it yourself. Visit the VAERS website at www.vaers.SamedayNews.es or call 973-383-0770.VAERS is only for reporting reactions, and VAERS staff do not give medical  advice. 6. The National Vaccine Injury Compensation Program The [employer] Vaccine Injury Compensation Program (VICP) is a federal program that was created to compensate people who may have been injured by certain vaccines. Visit the VICP website at  www.hrsa.gov/vaccinecompensation or call 1-800-338-2382 to learn about the program and about filing a claim. There is a time limit to file a claim for compensation. 7. How can I learn more?  Ask your healthcare provider.  Call your local or state health department.  Contact the Centers for Disease Control and Prevention (CDC): ? Call 1-800-232-4636 (1-800-CDC-INFO) or ? Visit CDC's www.cdc.gov/flu Vaccine Information Statement (Interim) Inactivated Influenza Vaccine (09/27/2017) This information is not intended to replace advice given to you by your health care provider. Make sure you discuss any questions you have with your health care provider. Document Revised: 05/21/2018 Document Reviewed: 10/01/2017 Elsevier Patient Education  2020 Elsevier Inc.  

## 2020-01-26 NOTE — Progress Notes (Signed)
NP-present today to est care and well woman exam and IUD removal. . Pt stated having breast tenderness and denies any other issues at this time.

## 2020-01-27 ENCOUNTER — Other Ambulatory Visit (HOSPITAL_COMMUNITY)
Admission: RE | Admit: 2020-01-27 | Discharge: 2020-01-27 | Disposition: A | Discharge status: Home / Self Care | Payer: BC Managed Care – PPO | Source: Ambulatory Visit | Attending: Obstetrics and Gynecology | Admitting: Obstetrics and Gynecology

## 2020-01-27 ENCOUNTER — Encounter: Payer: Self-pay | Admitting: Obstetrics and Gynecology

## 2020-01-27 ENCOUNTER — Ambulatory Visit (INDEPENDENT_AMBULATORY_CARE_PROVIDER_SITE_OTHER): Payer: BC Managed Care – PPO | Admitting: Obstetrics and Gynecology

## 2020-01-27 ENCOUNTER — Other Ambulatory Visit: Payer: Self-pay

## 2020-01-27 VITALS — BP 127/80 | HR 80 | Ht 66.0 in | Wt 126.0 lb

## 2020-01-27 DIAGNOSIS — N393 Stress incontinence (female) (male): Secondary | ICD-10-CM

## 2020-01-27 DIAGNOSIS — Z124 Encounter for screening for malignant neoplasm of cervix: Secondary | ICD-10-CM | POA: Diagnosis present

## 2020-01-27 DIAGNOSIS — R6882 Decreased libido: Secondary | ICD-10-CM

## 2020-01-27 DIAGNOSIS — Z30432 Encounter for removal of intrauterine contraceptive device: Secondary | ICD-10-CM

## 2020-01-27 DIAGNOSIS — Z23 Encounter for immunization: Secondary | ICD-10-CM | POA: Diagnosis not present

## 2020-01-27 DIAGNOSIS — Z01419 Encounter for gynecological examination (general) (routine) without abnormal findings: Secondary | ICD-10-CM | POA: Diagnosis present

## 2020-01-27 DIAGNOSIS — Z0000 Encounter for general adult medical examination without abnormal findings: Secondary | ICD-10-CM

## 2020-01-27 NOTE — Progress Notes (Signed)
GYNECOLOGY ANNUAL PHYSICAL EXAM PROGRESS NOTE  Subjective:    Debbie Green is a 38 y.o. G22P1012 married female who presents to establish care and for an annual exam.  The patient is sexually active. The patient wears seatbelts: yes. The patient participates in regular exercise: yes. Has the patient ever been transfused or tattooed?: no. The patient reports that there is not domestic violence in her life.   The patient has the following complaints today:  1. Notes that she desires to have her IUD removed today. Has been in place for almost 6 years and is due for removal. Her husband has recently gotten a vasectomy. She reports that she has also been noting some decreased libido since having the IUD in.  2.  Has been dealing with stress incontinence for the past 2 years. Notes when sneezing or exercising.  Occasionally wears a panty liner.  Not very bothersome but occasionally noticeable.    Menstrual History:   Patient's last menstrual period was 01/25/2020. Period Duration (Days): 3 Period Pattern: (!) Irregular (pt has iud) Menstrual Flow: Light Menstrual Control: Other (Comment) Menstrual Control Change Freq (Hours): 0 Dysmenorrhea: None    Gynecologic History:  Contraception: IUD (Mirena) History of STI's: Denies Last Pap: 04/19/2016. Results were: normal.  Denies h/o abnormal pap smears.    Upstream - 01/27/20 1009      Pregnancy Intention Screening   Does the patient want to become pregnant in the next year? No    Does the patient's partner want to become pregnant in the next year? No    Would the patient like to discuss contraceptive options today? No      Contraception Wrap Up   Current Method Vasectomy          The pregnancy intention screening data noted above was reviewed. Potential methods of contraception were not discussed. The patient elected to proceed with Vasectomy.    OB History  Gravida Para Term Preterm AB Living  2 1 1  0 1 2  SAB IAB  Ectopic Multiple Live Births  1 0 0 0 2    # Outcome Date GA Lbr Len/2nd Weight Sex Delivery Anes PTL Lv  2 Term 01/02/14   8 lb 11 oz (3.941 kg) M Vag-Spont None  LIV  1 SAB 2013 [redacted]w[redacted]d    SAB       Past Medical History:  Diagnosis Date  . Anxiety   . Bleeding ulcer 2012  . Cancer ([place])   . Melanoma ([place]) 01/2019   right thigh    Past Surgical History:  Procedure Laterality Date  . MELANOMA EXCISION Right     Family History  Problem Relation Age of Onset  . Anemia Mother   . Hyperlipidemia Father   . Hypertension Father   . Heart disease Maternal Grandmother   . Aneurysm Paternal Grandmother        Brain  . COPD Paternal Grandfather     Social History   Socioeconomic History  . Marital status: Married    Spouse name: Not on file  . Number of children: Not on file  . Years of education: Not on file  . Highest education level: Not on file  Occupational History  . Not on file  Tobacco Use  . Smoking status: Never Smoker  . Smokeless tobacco: Never Used  Vaping Use  . Vaping Use: Never used  Substance and Sexual Activity  . Alcohol use: Yes    Alcohol/week: 7.0 standard drinks  Types: 7 Glasses of wine per week    Comment: social  . Drug use: No  . Sexual activity: Yes    Birth control/protection: I.U.D.    Comment: Mirena  Other Topics Concern  . Not on file  Social History Narrative  . Not on file   Social Determinants of Health   Financial Resource Strain: Not on file  Food Insecurity: Not on file  Transportation Needs: Not on file  Physical Activity: Not on file  Stress: Not on file  Social Connections: Not on file  Intimate Partner Violence: Not on file    Current Outpatient Medications on File Prior to Visit  Medication Sig Dispense Refill  . cyclobenzaprine (FLEXERIL) 5 MG tablet Take 1 tablet (5 mg total) by mouth at bedtime as needed for muscle spasms. 30 tablet 1  . fluocinolone (SYNALAR) 0.01 % external solution Apply topically 2  (two) times daily. 90 mL 6  . fluticasone (FLONASE) 50 MCG/ACT nasal spray Place 2 sprays into both nostrils daily. 16 g 6  . levonorgestrel (MIRENA) 20 MCG/24HR IUD 1 each by Intrauterine route once.     No current facility-administered medications on file prior to visit.    No Known Allergies    Review of Systems Constitutional: negative for chills, fatigue, fevers and sweats Eyes: negative for irritation, redness and visual disturbance Ears, nose, mouth, throat, and face: negative for hearing loss, nasal congestion, snoring and tinnitus Respiratory: negative for asthma, cough, sputum Cardiovascular: negative for chest pain, dyspnea, exertional chest pressure/discomfort, irregular heart beat, palpitations and syncope Gastrointestinal: negative for abdominal pain, change in bowel habits, nausea and vomiting Genitourinary: negative for abnormal menstrual periods, genital lesions, sexual problems and vaginal discharge, dysuria. Positive for urinary incontinence and decreased libido (See HPI).  Integument/breast: negative for breast lump, breast tenderness and nipple discharge Hematologic/lymphatic: negative for bleeding and easy bruising Musculoskeletal:negative for back pain and muscle weakness Neurological: negative for dizziness, headaches, vertigo and weakness Endocrine: negative for diabetic symptoms including polydipsia, polyuria and skin dryness Allergic/Immunologic: negative for hay fever and urticaria        Objective:  Blood pressure 127/80, pulse 80, height 5\' 6"  (1.676 m), weight 126 lb (57.2 kg), last menstrual period 01/25/2020. Body mass index is 20.34 kg/m.  General Appearance:    Alert, cooperative, no distress, appears stated age  Head:    Normocephalic, without obvious abnormality, atraumatic  Eyes:    PERRL, conjunctiva/corneas clear, EOM's intact, both eyes  Ears:    Normal external ear canals, both ears  Nose:   Nares normal, septum midline, mucosa normal, no  drainage or sinus tenderness  Throat:   Lips, mucosa, and tongue normal; teeth and gums normal  Neck:   Supple, symmetrical, trachea midline, no adenopathy; thyroid: no enlargement/tenderness/nodules; no carotid bruit or JVD  Back:     Symmetric, no curvature, ROM normal, no CVA tenderness  Lungs:     Clear to auscultation bilaterally, respirations unlabored  Chest Wall:    No tenderness or deformity   Heart:    Regular rate and rhythm, S1 and S2 normal, no murmur, rub or gallop  Breast Exam:    No tenderness, masses, or nipple abnormality  Abdomen:     Soft, non-tender, bowel sounds active all four quadrants, no masses, no organomegaly.    Genitalia:    Pelvic:external genitalia normal, vagina without lesions, discharge, or tenderness, rectovaginal septum  normal. Cervix normal in appearance, no cervical motion tenderness, IUD threads visible, ~ 3 cm  in length. no adnexal masses or tenderness.  Uterus normal size, shape, mobile, regular contours, nontender.  Rectal:    Normal external sphincter.  No hemorrhoids appreciated. Internal exam not done.   Extremities:   Extremities normal, atraumatic, no cyanosis or edema  Pulses:   2+ and symmetric all extremities  Skin:   Skin color, texture, turgor normal, no rashes or lesions  Lymph nodes:   Cervical, supraclavicular, and axillary nodes normal  Neurologic:   CNII-XII intact, normal strength, sensation and reflexes throughout   .  Labs:  Lab Results  Component Value Date   WBC 5.6 11/14/2018   HGB 14.1 11/14/2018   HCT 42.2 11/14/2018   MCV 91 11/14/2018   PLT 254 11/14/2018    Lab Results  Component Value Date   CREATININE 0.79 08/30/2017   BUN 10 08/30/2017   NA 142 08/30/2017   K 3.7 08/30/2017   CL 104 08/30/2017   CO2 24 08/30/2017    Lab Results  Component Value Date   ALT 11 08/30/2017   AST 17 08/30/2017   ALKPHOS 69 08/30/2017   BILITOT 0.9 08/30/2017    Lab Results  Component Value Date   TSH 1.490 11/14/2018      Assessment:   1. Encounter for annual routine gynecological examination   2. Encounter for medical examination to establish care   3. Flu vaccine need   4. Stress incontinence, female   5. Decreased libido   6. Encounter for IUD removal   7. Pap smear for cervical cancer screening      Plan:    Blood tests: patient will have labs done with PCP. Needs to schedule an appointment.  Pap smear: performed today. Continue routine screening.  Breast self exam technique reviewed and patient encouraged to perform self-exam monthly. Contraception: IUD. Desires removal today (see procedure note below).  Discussed healthy lifestyle modifications. Advised that her libido may likely return once IUD removed. If after several months no improvement, can f/u.  Stress incontinence, mild. Discussed pelvic floor exercises, use of urinary pads, Poise Impressa inserts.  COVID vaccination status: has completed.  Flu vaccination: patient administered vaccine today.  Follow up in 1 year for annual exam, or sooner as needed.     IUD Removal Procedure Note Patient identified, verbal informed consent given.  Patient was in the dorsal lithotomy position, normal external genitalia was noted.  A speculum was placed in the patient's vagina, normal discharge was noted, no lesions. The cervix was visualized, no lesions, no abnormal discharge.  The strings of the IUD were grasped and pulled using ring forceps. The IUD was removed in its entirety. Patient tolerated the procedure well.    Patient will use vasectomy for contraceptionRoutine preventative health maintenance measures emphasized.     Rubie Maid, MD [facility]

## 2020-01-30 LAB — CYTOLOGY - PAP
Comment: NEGATIVE
Diagnosis: NEGATIVE
High risk HPV: NEGATIVE

## 2020-11-01 ENCOUNTER — Encounter: Payer: Self-pay | Admitting: Family Medicine

## 2020-11-18 ENCOUNTER — Ambulatory Visit: Payer: BC Managed Care – PPO | Admitting: Family Medicine

## 2020-11-29 ENCOUNTER — Encounter: Payer: Self-pay | Admitting: Family Medicine

## 2020-11-29 ENCOUNTER — Other Ambulatory Visit: Payer: Self-pay

## 2020-11-29 ENCOUNTER — Ambulatory Visit (INDEPENDENT_AMBULATORY_CARE_PROVIDER_SITE_OTHER): Payer: BC Managed Care – PPO | Admitting: Family Medicine

## 2020-11-29 VITALS — BP 112/75 | HR 80 | Ht 66.0 in | Wt 120.0 lb

## 2020-11-29 DIAGNOSIS — Z0000 Encounter for general adult medical examination without abnormal findings: Secondary | ICD-10-CM

## 2020-11-29 DIAGNOSIS — R6882 Decreased libido: Secondary | ICD-10-CM

## 2020-11-29 DIAGNOSIS — Z23 Encounter for immunization: Secondary | ICD-10-CM | POA: Diagnosis not present

## 2020-11-29 LAB — URINALYSIS, ROUTINE W REFLEX MICROSCOPIC
Bilirubin, UA: NEGATIVE
Glucose, UA: NEGATIVE
Ketones, UA: NEGATIVE
Leukocytes,UA: NEGATIVE
Nitrite, UA: NEGATIVE
Protein,UA: NEGATIVE
Specific Gravity, UA: 1.015 (ref 1.005–1.030)
Urobilinogen, Ur: 0.2 mg/dL (ref 0.2–1.0)
pH, UA: 7 (ref 5.0–7.5)

## 2020-11-29 LAB — MICROSCOPIC EXAMINATION
Bacteria, UA: NONE SEEN
Epithelial Cells (non renal): NONE SEEN /hpf (ref 0–10)
WBC, UA: NONE SEEN /hpf (ref 0–5)

## 2020-11-29 NOTE — Progress Notes (Signed)
BP 112/75   Pulse 80   Ht 5\' 6"  (1.676 m)   Wt 120 lb (54.4 kg)   BMI 19.37 kg/m    Subjective:    Patient ID: Debbie Green, female    DOB: April 18, 1980, 40 y.o.   MRN: [id number]  HPI: Debbie Green is a 40 y.o. female presenting on 11/29/2020 for comprehensive medical examination. Current medical complaints include:  Has been having some pain in her neck and tightness. She is doing chiropractry and massage .  Menopausal Symptoms: no  Depression Screen done today and results listed below:  Depression screen [facility] 2/9 11/29/2020 12/01/2019 11/14/2018 08/30/2017 02/22/2016  Decreased Interest 0 0 0 0 0  Down, Depressed, Hopeless 0 0 0 0 0  PHQ - 2 Score 0 0 0 0 0  Altered sleeping 0 - 0 0 -  Tired, decreased energy 0 - 1 0 -  Change in appetite 0 - 0 0 -  Feeling bad or failure about yourself  0 - 0 0 -  Trouble concentrating 0 - 0 0 -  Moving slowly or fidgety/restless 0 - 0 0 -  Suicidal thoughts 0 - 0 0 -  PHQ-9 Score 0 - 1 0 -  Difficult doing work/chores - - - Not difficult at all -    Past Medical History:  Past Medical History:  Diagnosis Date   Anxiety    Bleeding ulcer 2012   Cancer ([place])    Melanoma ([place]) 01/2019   right thigh    Surgical History:  Past Surgical History:  Procedure Laterality Date   MELANOMA EXCISION Right     Medications:  Current Outpatient Medications on File Prior to Visit  Medication Sig   cyclobenzaprine (FLEXERIL) 5 MG tablet Take 1 tablet (5 mg total) by mouth at bedtime as needed for muscle spasms.   fluticasone (FLONASE) 50 MCG/ACT nasal spray Place 2 sprays into both nostrils daily.   fluocinolone (SYNALAR) 0.01 % external solution Apply topically 2 (two) times daily.   No current facility-administered medications on file prior to visit.    Allergies:  No Known Allergies  Social History:  Social History   Socioeconomic History   Marital status: Married    Spouse name: Not on file   Number of children: Not  on file   Years of education: Not on file   Highest education level: Not on file  Occupational History   Not on file  Tobacco Use   Smoking status: Never   Smokeless tobacco: Never  Vaping Use   Vaping Use: Never used  Substance and Sexual Activity   Alcohol use: Yes    Alcohol/week: 7.0 standard drinks    Types: 7 Glasses of wine per week    Comment: social   Drug use: No   Sexual activity: Yes    Birth control/protection: I.U.D.    Comment: Mirena  Other Topics Concern   Not on file  Social History Narrative   Not on file   Social Determinants of Health   Financial Resource Strain: Not on file  Food Insecurity: Not on file  Transportation Needs: Not on file  Physical Activity: Not on file  Stress: Not on file  Social Connections: Not on file  Intimate Partner Violence: Not on file   Social History   Tobacco Use  Smoking Status Never  Smokeless Tobacco Never   Social History   Substance and Sexual Activity  Alcohol Use Yes   Alcohol/week: 7.0 standard drinks  Types: 7 Glasses of wine per week   Comment: social    Family History:  Family History  Problem Relation Age of Onset   Anemia Mother    Hyperlipidemia Father    Hypertension Father    Heart disease Maternal Grandmother    Aneurysm Paternal Grandmother        Brain   COPD Paternal Grandfather     Past medical history, surgical history, medications, allergies, family history and social history reviewed with patient today and changes made to appropriate areas of the chart.   Review of Systems  Constitutional: Negative.   HENT: Negative.    Eyes: Negative.   Respiratory: Negative.    Cardiovascular:  Positive for palpitations (occasionally). Negative for chest pain, orthopnea, claudication, leg swelling and PND.  Gastrointestinal: Negative.   Genitourinary: Negative.   Musculoskeletal:  Positive for myalgias and neck pain. Negative for back pain, falls and joint pain.  Skin: Negative.    Neurological: Negative.   Endo/Heme/Allergies:  Negative for environmental allergies and polydipsia. Bruises/bleeds easily.  Psychiatric/Behavioral: Negative.    All other ROS negative except what is listed above and in the HPI.      Objective:    BP 112/75   Pulse 80   Ht 5\' 6"  (1.676 m)   Wt 120 lb (54.4 kg)   BMI 19.37 kg/m   Wt Readings from Last 3 Encounters:  11/29/20 120 lb (54.4 kg)  01/27/20 126 lb (57.2 kg)  11/14/18 123 lb (55.8 kg)    Physical Exam Vitals and nursing note reviewed.  Constitutional:      General: She is not in acute distress.    Appearance: Normal appearance. She is normal weight. She is not ill-appearing, toxic-appearing or diaphoretic.  HENT:     Head: Normocephalic and atraumatic.     Right Ear: Tympanic membrane, ear canal and external ear normal. There is no impacted cerumen.     Left Ear: Tympanic membrane, ear canal and external ear normal. There is no impacted cerumen.     Nose: Nose normal. No congestion or rhinorrhea.     Mouth/Throat:     Mouth: Mucous membranes are moist.     Pharynx: Oropharynx is clear. No oropharyngeal exudate or posterior oropharyngeal erythema.  Eyes:     General: No scleral icterus.       Right eye: No discharge.        Left eye: No discharge.     Extraocular Movements: Extraocular movements intact.     Conjunctiva/sclera: Conjunctivae normal.     Pupils: Pupils are equal, round, and reactive to light.  Neck:     Vascular: No carotid bruit.  Cardiovascular:     Rate and Rhythm: Normal rate and regular rhythm.     Pulses: Normal pulses.     Heart sounds: No murmur heard.   No friction rub. No gallop.  Pulmonary:     Effort: Pulmonary effort is normal. No respiratory distress.     Breath sounds: Normal breath sounds. No stridor. No wheezing, rhonchi or rales.  Chest:     Chest wall: No tenderness.  Abdominal:     General: Abdomen is flat. Bowel sounds are normal. There is no distension.     Palpations:  Abdomen is soft. There is no mass.     Tenderness: There is no abdominal tenderness. There is no right CVA tenderness, left CVA tenderness, guarding or rebound.     Hernia: No hernia is present.  Genitourinary:  Comments: Breast and pelvic exams deferred with shared decision making Musculoskeletal:        General: No swelling, tenderness, deformity or signs of injury.     Cervical back: Normal range of motion and neck supple. No rigidity. No muscular tenderness.     Right lower leg: No edema.     Left lower leg: No edema.  Lymphadenopathy:     Cervical: No cervical adenopathy.  Skin:    General: Skin is warm and dry.     Capillary Refill: Capillary refill takes less than 2 seconds.     Coloration: Skin is not jaundiced or pale.     Findings: No bruising, erythema, lesion or rash.  Neurological:     General: No focal deficit present.     Mental Status: She is alert and oriented to person, place, and time. Mental status is at baseline.     Cranial Nerves: No cranial nerve deficit.     Sensory: No sensory deficit.     Motor: No weakness.     Coordination: Coordination normal.     Gait: Gait normal.     Deep Tendon Reflexes: Reflexes normal.  Psychiatric:        Mood and Affect: Mood normal.        Behavior: Behavior normal.        Thought Content: Thought content normal.        Judgment: Judgment normal.    Results for orders placed or performed in visit on 01/27/20  Cytology - PAP  Result Value Ref Range   High risk HPV Negative    Adequacy      Satisfactory for evaluation; transformation zone component PRESENT.   Diagnosis      - Negative for intraepithelial lesion or malignancy (NILM)   Comment Normal Reference Range HPV - Negative       Assessment & Plan:   Problem List Items Addressed This Visit   None Visit Diagnoses     Routine general medical examination at a health care facility    -  Primary   Vaccines up to date. Screening labs checked today. Pap up to  date. Continue diet and exercise. Call with any concerns.    Relevant Orders   CBC with Differential/Platelet   Comprehensive metabolic panel   Lipid Panel w/o Chol/HDL Ratio   Urinalysis, Routine w reflex microscopic   TSH   HIV Antibody (routine testing w rflx)   Hepatitis C Antibody   VITAMIN D 25 Hydroxy (Vit-D Deficiency, Fractures)   Needs flu shot       Flu shot given today.    Relevant Orders   Flu Vaccine QUAD 6+ mos PF IM (Fluarix Quad PF)   Low libido       Will check labs. Await results.    Relevant Orders   LH   [place]   Estradiol   Testosterone, free, total([facility])        Follow up plan: Return in about 1 year (around 11/29/2021).   LABORATORY TESTING:  - Pap smear: up to date  IMMUNIZATIONS:   - Tdap: Tetanus vaccination status reviewed: last tetanus booster within 10 years. - Influenza: Administered today - Pneumovax: Not applicable - COVID: Up to date  PATIENT COUNSELING:   Advised to take 1 mg of folate supplement per day if capable of pregnancy.   Sexuality: Discussed sexually transmitted diseases, partner selection, use of condoms, avoidance of unintended pregnancy  and contraceptive alternatives.   Advised to avoid cigarette  smoking.  I discussed with the patient that most people either abstain from alcohol or drink within safe limits (<=14/week and <=4 drinks/occasion for males, <=7/weeks and <= 3 drinks/occasion for females) and that the risk for alcohol disorders and other health effects rises proportionally with the number of drinks per week and how often a drinker exceeds daily limits.  Discussed cessation/primary prevention of drug use and availability of treatment for abuse.   Diet: Encouraged to adjust caloric intake to maintain  or achieve ideal body weight, to reduce intake of dietary saturated fat and total fat, to limit sodium intake by avoiding high sodium foods and not adding table salt, and to maintain adequate dietary  potassium and calcium preferably from fresh fruits, vegetables, and low-fat dairy products.    stressed the importance of regular exercise  Injury prevention: Discussed safety belts, safety helmets, smoke detector, smoking near bedding or upholstery.   Dental health: Discussed importance of regular tooth brushing, flossing, and dental visits.    NEXT PREVENTATIVE PHYSICAL DUE IN 1 YEAR. Return in about 1 year (around 11/29/2021).

## 2020-11-30 ENCOUNTER — Encounter: Payer: Self-pay | Admitting: Family Medicine

## 2020-11-30 ENCOUNTER — Other Ambulatory Visit: Payer: Self-pay | Admitting: Family Medicine

## 2020-11-30 LAB — COMPREHENSIVE METABOLIC PANEL
ALT: 15 IU/L (ref 0–32)
AST: 21 IU/L (ref 0–40)
Albumin/Globulin Ratio: 2.4 — ABNORMAL HIGH (ref 1.2–2.2)
Albumin: 5.3 g/dL — ABNORMAL HIGH (ref 3.8–4.8)
Alkaline Phosphatase: 80 IU/L (ref 44–121)
BUN/Creatinine Ratio: 8 — ABNORMAL LOW (ref 9–23)
BUN: 8 mg/dL (ref 6–20)
Bilirubin Total: 0.5 mg/dL (ref 0.0–1.2)
CO2: 21 mmol/L (ref 20–29)
Calcium: 9.4 mg/dL (ref 8.7–10.2)
Chloride: 103 mmol/L (ref 96–106)
Creatinine, Ser: 0.95 mg/dL (ref 0.57–1.00)
Globulin, Total: 2.2 g/dL (ref 1.5–4.5)
Glucose: 92 mg/dL (ref 70–99)
Potassium: 3.9 mmol/L (ref 3.5–5.2)
Sodium: 141 mmol/L (ref 134–144)
Total Protein: 7.5 g/dL (ref 6.0–8.5)
eGFR: 78 mL/min/{1.73_m2} (ref >59)

## 2020-11-30 LAB — ESTRADIOL: Estradiol: 50.9 pg/mL

## 2020-11-30 LAB — CBC WITH DIFFERENTIAL/PLATELET
Basophils Absolute: 0.1 10*3/uL (ref 0.0–0.2)
Basos: 1 %
EOS (ABSOLUTE): 0 10*3/uL (ref 0.0–0.4)
Eos: 1 %
Hematocrit: 41.6 % (ref 34.0–46.6)
Hemoglobin: 13.9 g/dL (ref 11.1–15.9)
Immature Grans (Abs): 0 10*3/uL (ref 0.0–0.1)
Immature Granulocytes: 0 %
Lymphocytes Absolute: 1.3 10*3/uL (ref 0.7–3.1)
Lymphs: 24 %
MCH: 30.3 pg (ref 26.6–33.0)
MCHC: 33.4 g/dL (ref 31.5–35.7)
MCV: 91 fL (ref 79–97)
Monocytes Absolute: 0.5 10*3/uL (ref 0.1–0.9)
Monocytes: 8 %
Neutrophils Absolute: 3.6 10*3/uL (ref 1.4–7.0)
Neutrophils: 66 %
Platelets: 317 10*3/uL (ref 150–450)
RBC: 4.59 x10E6/uL (ref 3.77–5.28)
RDW: 11.9 % (ref 11.7–15.4)
WBC: 5.5 10*3/uL (ref 3.4–10.8)

## 2020-11-30 LAB — HIV ANTIBODY (ROUTINE TESTING W REFLEX): HIV Screen 4th Generation wRfx: NONREACTIVE

## 2020-11-30 LAB — LIPID PANEL W/O CHOL/HDL RATIO
Cholesterol, Total: 220 mg/dL — ABNORMAL HIGH (ref 100–199)
HDL: 89 mg/dL (ref >39)
LDL Chol Calc (NIH): 124 mg/dL — ABNORMAL HIGH (ref 0–99)
Triglycerides: 42 mg/dL (ref 0–149)
VLDL Cholesterol Cal: 7 mg/dL (ref 5–40)

## 2020-11-30 LAB — TESTOSTERONE, FREE, TOTAL, SHBG
Sex Hormone Binding: 79.3 nmol/L (ref 24.6–122.0)
Testosterone, Free: 0.7 pg/mL (ref 0.0–4.2)
Testosterone: 5 ng/dL — ABNORMAL LOW (ref 8–60)

## 2020-11-30 LAB — HEPATITIS C ANTIBODY: Hep C Virus Ab: <0.1 s/co ratio (ref 0.0–0.9)

## 2020-11-30 LAB — FOLLICLE STIMULATING HORMONE: FSH: 6.4 m[IU]/mL

## 2020-11-30 LAB — VITAMIN D 25 HYDROXY (VIT D DEFICIENCY, FRACTURES): Vit D, 25-Hydroxy: 13 ng/mL — ABNORMAL LOW (ref 30.0–100.0)

## 2020-11-30 LAB — TSH: TSH: 1.1 u[IU]/mL (ref 0.450–4.500)

## 2020-11-30 LAB — LUTEINIZING HORMONE: LH: 3.3 m[IU]/mL

## 2020-11-30 MED ORDER — VITAMIN D (ERGOCALCIFEROL) 1.25 MG (50000 UNIT) PO CAPS: 50000.0000 [IU] | ORAL_CAPSULE | ORAL | 0 refills | Status: DC

## 2021-01-04 ENCOUNTER — Encounter: Payer: Self-pay | Admitting: Family Medicine

## 2021-01-04 DIAGNOSIS — J351 Hypertrophy of tonsils: Secondary | ICD-10-CM

## 2021-01-04 NOTE — Telephone Encounter (Signed)
Can we check if she'd like to be seen or if she'd like a referral to ENT

## 2021-03-01 ENCOUNTER — Ambulatory Visit (INDEPENDENT_AMBULATORY_CARE_PROVIDER_SITE_OTHER): Payer: BC Managed Care – PPO | Admitting: Obstetrics and Gynecology

## 2021-03-01 ENCOUNTER — Encounter: Payer: Self-pay | Admitting: Obstetrics and Gynecology

## 2021-03-01 ENCOUNTER — Other Ambulatory Visit: Payer: Self-pay

## 2021-03-01 VITALS — BP 122/74 | HR 92 | Ht 66.0 in | Wt 124.0 lb

## 2021-03-01 DIAGNOSIS — R6882 Decreased libido: Secondary | ICD-10-CM

## 2021-03-01 DIAGNOSIS — N6012 Diffuse cystic mastopathy of left breast: Secondary | ICD-10-CM

## 2021-03-01 DIAGNOSIS — Z01419 Encounter for gynecological examination (general) (routine) without abnormal findings: Secondary | ICD-10-CM | POA: Diagnosis not present

## 2021-03-01 NOTE — Progress Notes (Signed)
GYNECOLOGY ANNUAL PHYSICAL EXAM PROGRESS NOTE  Subjective:    Debbie Green is a 41 y.o. [phone] married female who presents to establish care and for an annual exam.  The patient is sexually active. The patient wears seatbelts: yes. The patient participates in regular exercise: yes. Has the patient ever been transfused or tattooed?: no. The patient reports that there is not domestic violence in her life.   The patient has the following complaints today:  Notes that she is still experiencing some decreased libido. Does note a slight return after removal of her IUD last year. Had labs checked with PCP which were normal. Is also planning on engaging in counseling soon.  Reports that she has a constant issue off and on with hemorrhoids since the birth of her child.    Menstrual History:   Patient's last menstrual period was 02/13/2021. Period Cycle (Days): 28 Period Duration (Days): 5 Period Pattern: Regular Menstrual Flow: Light Menstrual Control: Maxi pad Menstrual Control Change Freq (Hours): 4 Dysmenorrhea: None   Gynecologic History:  Contraception: vasectomy History of STI's: Denies Last Pap: 01/27/2020. Results were: normal.  Denies h/o abnormal pap smears.   The pregnancy intention screening data noted above was reviewed. Potential methods of contraception were not discussed. The patient elected to proceed with Vasectomy.    OB History  Gravida Para Term Preterm AB Living  2 1 1  0 1 1  SAB IAB Ectopic Multiple Live Births  1 0 0 0 1    # Outcome Date GA Lbr Len/2nd Weight Sex Delivery Anes PTL Lv  2 Term 01/02/14   8 lb 11 oz (3.941 kg) M Vag-Spont None  LIV  1 SAB 2013 [redacted]w[redacted]d    SAB       Past Medical History:  Diagnosis Date   Anxiety    Bleeding ulcer 2012   Cancer ([place])    Melanoma ([place]) 01/2019   right thigh    Past Surgical History:  Procedure Laterality Date   MELANOMA EXCISION Right     Family History  Problem Relation Age of Onset    Anemia Mother    Hyperlipidemia Father    Hypertension Father    Heart disease Maternal Grandmother    Aneurysm Paternal Grandmother        Brain   COPD Paternal Grandfather     Social History   Socioeconomic History   Marital status: Married    Spouse name: Not on file   Number of children: Not on file   Years of education: Not on file   Highest education level: Not on file  Occupational History   Not on file  Tobacco Use   Smoking status: Never   Smokeless tobacco: Never  Vaping Use   Vaping Use: Never used  Substance and Sexual Activity   Alcohol use: Yes    Alcohol/week: 7.0 standard drinks    Types: 7 Glasses of wine per week   Drug use: No   Sexual activity: Yes    Comment: vasectomy  Other Topics Concern   Not on file  Social History Narrative   Not on file   Social Determinants of Health   Financial Resource Strain: Not on file  Food Insecurity: Not on file  Transportation Needs: Not on file  Physical Activity: Not on file  Stress: Not on file  Social Connections: Not on file  Intimate Partner Violence: Not on file    Current Outpatient Medications on File Prior to Visit  Medication Sig Dispense Refill   fluticasone (FLONASE) 50 MCG/ACT nasal spray Place 2 sprays into both nostrils daily. 16 g 6   VITAMIN D PO Take by mouth.     cyclobenzaprine (FLEXERIL) 5 MG tablet Take 1 tablet (5 mg total) by mouth at bedtime as needed for muscle spasms. (Patient not taking: Reported on 03/01/2021) 30 tablet 1   No current facility-administered medications on file prior to visit.    No Known Allergies    Review of Systems Constitutional: negative for chills, fatigue, fevers and sweats Eyes: negative for irritation, redness and visual disturbance Ears, nose, mouth, throat, and face: negative for hearing loss, nasal congestion, snoring and tinnitus Respiratory: negative for asthma, cough, sputum Cardiovascular: negative for chest pain, dyspnea, exertional  chest pressure/discomfort, irregular heart beat, palpitations and syncope Gastrointestinal: negative for abdominal pain, change in bowel habits, nausea and vomiting Genitourinary: negative for abnormal menstrual periods, genital lesions, sexual problems and vaginal discharge, dysuria. Positive for urinary incontinence and decreased libido (See HPI).  Integument/breast: negative for breast lump, breast tenderness and nipple discharge Hematologic/lymphatic: negative for bleeding and easy bruising Musculoskeletal:negative for back pain and muscle weakness Neurological: negative for dizziness, headaches, vertigo and weakness Endocrine: negative for diabetic symptoms including polydipsia, polyuria and skin dryness Allergic/Immunologic: negative for hay fever and urticaria        Objective:  Blood pressure 122/74, pulse 92, height 5\' 6"  (1.676 m), weight 124 lb (56.2 kg), last menstrual period 02/13/2021. Body mass index is 20.01 kg/m.  General Appearance:    Alert, cooperative, no distress, appears stated age  Head:    Normocephalic, without obvious abnormality, atraumatic  Eyes:    PERRL, conjunctiva/corneas clear, EOM's intact, both eyes  Ears:    Normal external ear canals, both ears  Nose:   Nares normal, septum midline, mucosa normal, no drainage or sinus tenderness  Throat:   Lips, mucosa, and tongue normal; teeth and gums normal  Neck:   Supple, symmetrical, trachea midline, no adenopathy; thyroid: no enlargement/tenderness/nodules; no carotid bruit or JVD  Back:     Symmetric, no curvature, ROM normal, no CVA tenderness  Lungs:     Clear to auscultation bilaterally, respirations unlabored  Chest Wall:    No tenderness or deformity   Heart:    Regular rate and rhythm, S1 and S2 normal, no murmur, rub or gallop  Breast Exam:    No tenderness, masses, or nipple abnormality. Fibrocystic changes of left breast.   Abdomen:     Soft, non-tender, bowel sounds active all four quadrants, no  masses, no organomegaly.    Genitalia:    Pelvic:external genitalia normal, vagina without lesions, discharge, or tenderness, rectovaginal septum  normal. Cervix normal in appearance, no cervical motion tenderness, IUD threads visible, ~ 3 cm in length. no adnexal masses or tenderness.  Uterus normal size, shape, mobile, regular contours, nontender.  Rectal:    Normal external sphincter.  No hemorrhoids appreciated. Internal exam not done.   Extremities:   Extremities normal, atraumatic, no cyanosis or edema  Pulses:   2+ and symmetric all extremities  Skin:   Skin color, texture, turgor normal, no rashes or lesions  Lymph nodes:   Cervical, supraclavicular, and axillary nodes normal  Neurologic:   CNII-XII intact, normal strength, sensation and reflexes throughout   .  Labs:  Lab Results  Component Value Date   WBC 5.5 11/29/2020   HGB 13.9 11/29/2020   HCT 41.6 11/29/2020   MCV 91 11/29/2020  PLT 317 11/29/2020    Lab Results  Component Value Date   CREATININE 0.95 11/29/2020   BUN 8 11/29/2020   NA 141 11/29/2020   K 3.9 11/29/2020   CL 103 11/29/2020   CO2 21 11/29/2020    Lab Results  Component Value Date   ALT 15 11/29/2020   AST 21 11/29/2020   ALKPHOS 80 11/29/2020   BILITOT 0.5 11/29/2020    Lab Results  Component Value Date   TSH 1.100 11/29/2020     Assessment:   1. Well woman exam with routine gynecological exam   2. Decreased libido   3. Fibrocystic change of breast, left    Plan:    Blood tests: patient will have labs done with PCP.  Pap smear: up to date. Breast self exam technique reviewed and patient encouraged to perform self-exam monthly. Contraception: vasectomy Mammogram: Due to begin screenings this year at age 56. Will have PCP order.  Discussed healthy lifestyle modifications. Decreased libido, given info on medication options for enhancement, encouraged counseling. COVID vaccination status: has completed. Plans to get booster.   Flu vaccination: up to date.  Follow up in 1 year for annual exam, or sooner as needed.    Rubie Maid, MD [facility]

## 2021-11-30 ENCOUNTER — Ambulatory Visit (INDEPENDENT_AMBULATORY_CARE_PROVIDER_SITE_OTHER): Payer: BC Managed Care – PPO | Admitting: Family Medicine

## 2021-11-30 ENCOUNTER — Encounter: Payer: Self-pay | Admitting: Family Medicine

## 2021-11-30 VITALS — BP 116/78 | HR 80 | Ht 66.0 in | Wt 124.9 lb

## 2021-11-30 DIAGNOSIS — E559 Vitamin D deficiency, unspecified: Secondary | ICD-10-CM | POA: Diagnosis not present

## 2021-11-30 DIAGNOSIS — Z1231 Encounter for screening mammogram for malignant neoplasm of breast: Secondary | ICD-10-CM | POA: Diagnosis not present

## 2021-11-30 DIAGNOSIS — Z0000 Encounter for general adult medical examination without abnormal findings: Secondary | ICD-10-CM | POA: Diagnosis not present

## 2021-11-30 DIAGNOSIS — Z23 Encounter for immunization: Secondary | ICD-10-CM | POA: Diagnosis not present

## 2021-11-30 LAB — URINALYSIS, ROUTINE W REFLEX MICROSCOPIC
Bilirubin, UA: NEGATIVE
Glucose, UA: NEGATIVE
Ketones, UA: NEGATIVE
Leukocytes,UA: NEGATIVE
Nitrite, UA: NEGATIVE
Protein,UA: NEGATIVE
RBC, UA: NEGATIVE
Specific Gravity, UA: 1.01 (ref 1.005–1.030)
Urobilinogen, Ur: 0.2 mg/dL (ref 0.2–1.0)
pH, UA: 7 (ref 5.0–7.5)

## 2021-11-30 NOTE — Patient Instructions (Signed)
Please call to schedule your mammogram:  [facility]  Address: [address], [place], [place] [address] Phone: [phone]  [facility] [place] [address] [place],  [place]  [address] Phone: [phone]   

## 2021-11-30 NOTE — Progress Notes (Signed)
BP 116/78   Pulse 80   Ht 5' 6"  (1.676 m)   Wt 124 lb 14.4 oz (56.7 kg)   SpO2 100%   BMI 20.16 kg/m    Subjective:    Patient ID: Debbie Green, female    DOB: [birth year], 41 y.o.   MRN: [id number]  HPI: Debbie Green is a 41 y.o. female presenting on 11/30/2021 for comprehensive medical examination. Current medical complaints include:  Having a pain in her mouth and pain in her R leg for a couple of days. Has had some SI joint   Menopausal Symptoms: no  Depression Screen done today and results listed below:     11/30/2021    8:14 AM 03/01/2021    9:45 AM 11/29/2020    1:04 PM 12/01/2019    8:06 AM 11/14/2018    9:39 AM  Depression screen PHQ 2/9  Decreased Interest 0 0 0 0 0  Down, Depressed, Hopeless 0 0 0 0 0  PHQ - 2 Score 0 0 0 0 0  Altered sleeping 0 0 0  0  Tired, decreased energy 1 0 0  1  Change in appetite 0 0 0  0  Feeling bad or failure about yourself  0 0 0  0  Trouble concentrating 0 0 0  0  Moving slowly or fidgety/restless 0 0 0  0  Suicidal thoughts 0 0 0  0  PHQ-9 Score 1 0 0  1  Difficult doing work/chores Not difficult at all Not difficult at all       Past Medical History:  Past Medical History:  Diagnosis Date   Anxiety    Bleeding ulcer 2012   Cancer [facility])    Melanoma ([place]) 01/2019   right thigh    Surgical History:  Past Surgical History:  Procedure Laterality Date   MELANOMA EXCISION Right     Medications:  Current Outpatient Medications on File Prior to Visit  Medication Sig   magnesium 30 MG tablet Take 30 mg by mouth 2 (two) times daily.   Multiple Vitamin (MULTIVITAMIN) tablet Take 1 tablet by mouth daily.   VITAMIN D PO Take by mouth. (Patient not taking: Reported on 11/30/2021)   No current facility-administered medications on file prior to visit.    Allergies:  No Known Allergies  Social History:  Social History   Socioeconomic History   Marital status: Married    Spouse name: Not on file    Number of children: Not on file   Years of education: Not on file   Highest education level: Not on file  Occupational History   Not on file  Tobacco Use   Smoking status: Never   Smokeless tobacco: Never  Vaping Use   Vaping Use: Never used  Substance and Sexual Activity   Alcohol use: Yes    Alcohol/week: 7.0 standard drinks of alcohol    Types: 7 Glasses of wine per week   Drug use: No   Sexual activity: Yes    Comment: vasectomy  Other Topics Concern   Not on file  Social History Narrative   Not on file   Social Determinants of Health   Financial Resource Strain: Not on file  Food Insecurity: Not on file  Transportation Needs: Not on file  Physical Activity: Not on file  Stress: Not on file  Social Connections: Not on file  Intimate Partner Violence: Not on file   Social History   Tobacco Use  Smoking Status  Never  Smokeless Tobacco Never   Social History   Substance and Sexual Activity  Alcohol Use Yes   Alcohol/week: 7.0 standard drinks of alcohol   Types: 7 Glasses of wine per week    Family History:  Family History  Problem Relation Age of Onset   Anemia Mother    Hyperlipidemia Father    Hypertension Father    Heart disease Maternal Grandmother    Aneurysm Paternal Grandmother        Brain   COPD Paternal Grandfather     Past medical history, surgical history, medications, allergies, family history and social history reviewed with patient today and changes made to appropriate areas of the chart.   Review of Systems  Constitutional: Negative.   HENT: Negative.    Eyes: Negative.   Respiratory: Negative.    Cardiovascular:  Positive for palpitations. Negative for chest pain, orthopnea, claudication, leg swelling and PND.  Gastrointestinal: Negative.   Genitourinary: Negative.   Musculoskeletal:  Positive for myalgias. Negative for back pain, falls, joint pain and neck pain.  Skin: Negative.   Neurological: Negative.   Endo/Heme/Allergies:  Negative.   Psychiatric/Behavioral: Negative.     All other ROS negative except what is listed above and in the HPI.      Objective:    BP 116/78   Pulse 80   Ht 5' 6"  (1.676 m)   Wt 124 lb 14.4 oz (56.7 kg)   SpO2 100%   BMI 20.16 kg/m   Wt Readings from Last 3 Encounters:  11/30/21 124 lb 14.4 oz (56.7 kg)  03/01/21 124 lb (56.2 kg)  11/29/20 120 lb (54.4 kg)    Physical Exam Vitals and nursing note reviewed.  Constitutional:      General: She is not in acute distress.    Appearance: Normal appearance. She is normal weight. She is not ill-appearing, toxic-appearing or diaphoretic.  HENT:     Head: Normocephalic and atraumatic.     Right Ear: Tympanic membrane, ear canal and external ear normal. There is no impacted cerumen.     Left Ear: Tympanic membrane, ear canal and external ear normal. There is no impacted cerumen.     Nose: Nose normal. No congestion or rhinorrhea.     Mouth/Throat:     Mouth: Mucous membranes are moist.     Pharynx: Oropharynx is clear. No oropharyngeal exudate or posterior oropharyngeal erythema.  Eyes:     General: No scleral icterus.       Right eye: No discharge.        Left eye: No discharge.     Extraocular Movements: Extraocular movements intact.     Conjunctiva/sclera: Conjunctivae normal.     Pupils: Pupils are equal, round, and reactive to light.  Neck:     Vascular: No carotid bruit.  Cardiovascular:     Rate and Rhythm: Normal rate and regular rhythm.     Pulses: Normal pulses.     Heart sounds: No murmur heard.    No friction rub. No gallop.  Pulmonary:     Effort: Pulmonary effort is normal. No respiratory distress.     Breath sounds: Normal breath sounds. No stridor. No wheezing, rhonchi or rales.  Chest:     Chest wall: No tenderness.  Abdominal:     General: Abdomen is flat. Bowel sounds are normal. There is no distension.     Palpations: Abdomen is soft. There is no mass.     Tenderness: There is no abdominal  tenderness. There is no right CVA tenderness, left CVA tenderness, guarding or rebound.     Hernia: No hernia is present.  Genitourinary:    Comments: Breast and pelvic exams deferred with shared decision making Musculoskeletal:        General: No swelling, tenderness, deformity or signs of injury.     Cervical back: Normal range of motion and neck supple. No rigidity. No muscular tenderness.     Right lower leg: No edema.     Left lower leg: No edema.  Lymphadenopathy:     Cervical: No cervical adenopathy.  Skin:    General: Skin is warm and dry.     Capillary Refill: Capillary refill takes less than 2 seconds.     Coloration: Skin is not jaundiced or pale.     Findings: No bruising, erythema, lesion or rash.  Neurological:     General: No focal deficit present.     Mental Status: She is alert and oriented to person, place, and time. Mental status is at baseline.     Cranial Nerves: No cranial nerve deficit.     Sensory: No sensory deficit.     Motor: No weakness.     Coordination: Coordination normal.     Gait: Gait normal.     Deep Tendon Reflexes: Reflexes normal.  Psychiatric:        Mood and Affect: Mood normal.        Behavior: Behavior normal.        Thought Content: Thought content normal.        Judgment: Judgment normal.     Results for orders placed or performed in visit on 11/29/20  Microscopic Examination   Urine  Result Value Ref Range   WBC, UA None seen 0 - 5 /hpf   RBC, Urine 0-2 0 - 2 /hpf   Epithelial Cells (non renal) None seen 0 - 10 /hpf   Bacteria, UA None seen None seen/Few  CBC with Differential/Platelet  Result Value Ref Range   WBC 5.5 3.4 - 10.8 x10E3/uL   RBC 4.59 3.77 - 5.28 x10E6/uL   Hemoglobin 13.9 11.1 - 15.9 g/dL   Hematocrit 41.6 34.0 - 46.6 %   MCV 91 79 - 97 fL   MCH 30.3 26.6 - 33.0 pg   MCHC 33.4 31.5 - 35.7 g/dL   RDW 11.9 11.7 - 15.4 %   Platelets 317 150 - 450 x10E3/uL   Neutrophils 66 Not Estab. %   Lymphs 24 Not Estab.  %   Monocytes 8 Not Estab. %   Eos 1 Not Estab. %   Basos 1 Not Estab. %   Neutrophils Absolute 3.6 1.4 - 7.0 x10E3/uL   Lymphocytes Absolute 1.3 0.7 - 3.1 x10E3/uL   Monocytes Absolute 0.5 0.1 - 0.9 x10E3/uL   EOS (ABSOLUTE) 0.0 0.0 - 0.4 x10E3/uL   Basophils Absolute 0.1 0.0 - 0.2 x10E3/uL   Immature Granulocytes 0 Not Estab. %   Immature Grans (Abs) 0.0 0.0 - 0.1 x10E3/uL  Comprehensive metabolic panel  Result Value Ref Range   Glucose 92 70 - 99 mg/dL   BUN 8 6 - 20 mg/dL   Creatinine, Ser 0.95 0.57 - 1.00 mg/dL   eGFR 78 >59 mL/min/1.73   BUN/Creatinine Ratio 8 (L) 9 - 23   Sodium 141 134 - 144 mmol/L   Potassium 3.9 3.5 - 5.2 mmol/L   Chloride 103 96 - 106 mmol/L   CO2 21 20 - 29 mmol/L   Calcium  9.4 8.7 - 10.2 mg/dL   Total Protein 7.5 6.0 - 8.5 g/dL   Albumin 5.3 (H) 3.8 - 4.8 g/dL   Globulin, Total 2.2 1.5 - 4.5 g/dL   Albumin/Globulin Ratio 2.4 (H) 1.2 - 2.2   Bilirubin Total 0.5 0.0 - 1.2 mg/dL   Alkaline Phosphatase 80 44 - 121 IU/L   AST 21 0 - 40 IU/L   ALT 15 0 - 32 IU/L  Lipid Panel w/o Chol/HDL Ratio  Result Value Ref Range   Cholesterol, Total 220 (H) 100 - 199 mg/dL   Triglycerides 42 0 - 149 mg/dL   HDL 89 >39 mg/dL   VLDL Cholesterol Cal 7 5 - 40 mg/dL   LDL Chol Calc (NIH) 124 (H) 0 - 99 mg/dL  Urinalysis, Routine w reflex microscopic  Result Value Ref Range   Specific Gravity, UA 1.015 1.005 - 1.030   pH, UA 7.0 5.0 - 7.5   Color, UA Yellow Yellow   Appearance Ur Clear Clear   Leukocytes,UA Negative Negative   Protein,UA Negative Negative/Trace   Glucose, UA Negative Negative   Ketones, UA Negative Negative   RBC, UA 1+ (A) Negative   Bilirubin, UA Negative Negative   Urobilinogen, Ur 0.2 0.2 - 1.0 mg/dL   Nitrite, UA Negative Negative   Microscopic Examination See below:   TSH  Result Value Ref Range   TSH 1.100 0.450 - 4.500 uIU/mL  HIV Antibody (routine testing w rflx)  Result Value Ref Range   HIV Screen 4th Generation wRfx Non  Reactive Non Reactive  Hepatitis C Antibody  Result Value Ref Range   Hep C Virus Ab <0.1 0.0 - 0.9 s/co ratio  VITAMIN D 25 Hydroxy (Vit-D Deficiency, Fractures)  Result Value Ref Range   Vit D, 25-Hydroxy 13.0 (L) 30.0 - 100.0 ng/mL  LH  Result Value Ref Range   LH 3.3 mIU/mL  FSH  Result Value Ref Range   FSH 6.4 mIU/mL  Estradiol  Result Value Ref Range   Estradiol 50.9 pg/mL  Testosterone, free, total([facility])  Result Value Ref Range   Testosterone 5 (L) 8 - 60 ng/dL   Testosterone, Free 0.7 0.0 - 4.2 pg/mL   Sex Hormone Binding 79.3 24.6 - 122.0 nmol/L      Assessment & Plan:   Problem List Items Addressed This Visit   None Visit Diagnoses     Routine general medical examination at a health care facility    -  Primary   Vaccines up to date. Screening labs checked today. Pap up to date. Mammo ordered today. Continue diet and exercise. Call with any concerns.    Relevant Orders   CBC with Differential/Platelet   Comprehensive metabolic panel   Lipid Panel w/o Chol/HDL Ratio   Urinalysis, Routine w reflex microscopic   TSH   Vitamin D deficiency       Rechecking labs today. Await results. Treat as needed.    Relevant Orders   VITAMIN D 25 Hydroxy (Vit-D Deficiency, Fractures)   Need for influenza vaccination       Flu shot given today.   Relevant Orders   Flu Vaccine QUAD 6+ mos PF IM (Fluarix Quad PF) (Completed)   Encounter for screening mammogram for malignant neoplasm of breast       Mammogram ordered today.   Relevant Orders   MM 3D SCREEN BREAST BILATERAL        Follow up plan: Return in about 1 year (around 12/01/2022) for physical.  LABORATORY TESTING:  - Pap smear: up to date  IMMUNIZATIONS:   - Tdap: Tetanus vaccination status reviewed: last tetanus booster within 10 years. - Influenza: Administered today - Pneumovax: Not applicable - Prevnar: Not applicable - COVID: Up to date - HPV: Refused - Shingrix vaccine: Not  applicable  SCREENING: -Mammogram: Ordered today    PATIENT COUNSELING:   Advised to take 1 mg of folate supplement per day if capable of pregnancy.   Sexuality: Discussed sexually transmitted diseases, partner selection, use of condoms, avoidance of unintended pregnancy  and contraceptive alternatives.   Advised to avoid cigarette smoking.  I discussed with the patient that most people either abstain from alcohol or drink within safe limits (<=14/week and <=4 drinks/occasion for males, <=7/weeks and <= 3 drinks/occasion for females) and that the risk for alcohol disorders and other health effects rises proportionally with the number of drinks per week and how often a drinker exceeds daily limits.  Discussed cessation/primary prevention of drug use and availability of treatment for abuse.   Diet: Encouraged to adjust caloric intake to maintain  or achieve ideal body weight, to reduce intake of dietary saturated fat and total fat, to limit sodium intake by avoiding high sodium foods and not adding table salt, and to maintain adequate dietary potassium and calcium preferably from fresh fruits, vegetables, and low-fat dairy products.    stressed the importance of regular exercise  Injury prevention: Discussed safety belts, safety helmets, smoke detector, smoking near bedding or upholstery.   Dental health: Discussed importance of regular tooth brushing, flossing, and dental visits.    NEXT PREVENTATIVE PHYSICAL DUE IN 1 YEAR. Return in about 1 year (around 12/01/2022) for physical.

## 2021-12-01 LAB — COMPREHENSIVE METABOLIC PANEL
ALT: 17 IU/L (ref 0–32)
AST: 20 IU/L (ref 0–40)
Albumin/Globulin Ratio: 2 (ref 1.2–2.2)
Albumin: 4.7 g/dL (ref 3.9–4.9)
Alkaline Phosphatase: 72 IU/L (ref 44–121)
BUN/Creatinine Ratio: 13 (ref 9–23)
BUN: 11 mg/dL (ref 6–24)
Bilirubin Total: 0.5 mg/dL (ref 0.0–1.2)
CO2: 23 mmol/L (ref 20–29)
Calcium: 9.5 mg/dL (ref 8.7–10.2)
Chloride: 103 mmol/L (ref 96–106)
Creatinine, Ser: 0.88 mg/dL (ref 0.57–1.00)
Globulin, Total: 2.4 g/dL (ref 1.5–4.5)
Glucose: 85 mg/dL (ref 70–99)
Potassium: 4.3 mmol/L (ref 3.5–5.2)
Sodium: 141 mmol/L (ref 134–144)
Total Protein: 7.1 g/dL (ref 6.0–8.5)
eGFR: 85 mL/min/{1.73_m2} (ref >59)

## 2021-12-01 LAB — TSH: TSH: 1.13 u[IU]/mL (ref 0.450–4.500)

## 2021-12-01 LAB — LIPID PANEL W/O CHOL/HDL RATIO
Cholesterol, Total: 225 mg/dL — ABNORMAL HIGH (ref 100–199)
HDL: 93 mg/dL (ref >39)
LDL Chol Calc (NIH): 123 mg/dL — ABNORMAL HIGH (ref 0–99)
Triglycerides: 53 mg/dL (ref 0–149)
VLDL Cholesterol Cal: 9 mg/dL (ref 5–40)

## 2021-12-01 LAB — CBC WITH DIFFERENTIAL/PLATELET
Basophils Absolute: 0 10*3/uL (ref 0.0–0.2)
Basos: 1 %
EOS (ABSOLUTE): 0.1 10*3/uL (ref 0.0–0.4)
Eos: 1 %
Hematocrit: 41 % (ref 34.0–46.6)
Hemoglobin: 13.4 g/dL (ref 11.1–15.9)
Immature Grans (Abs): 0 10*3/uL (ref 0.0–0.1)
Immature Granulocytes: 0 %
Lymphocytes Absolute: 1.1 10*3/uL (ref 0.7–3.1)
Lymphs: 22 %
MCH: 29.5 pg (ref 26.6–33.0)
MCHC: 32.7 g/dL (ref 31.5–35.7)
MCV: 90 fL (ref 79–97)
Monocytes Absolute: 0.5 10*3/uL (ref 0.1–0.9)
Monocytes: 10 %
Neutrophils Absolute: 3.2 10*3/uL (ref 1.4–7.0)
Neutrophils: 66 %
Platelets: 303 10*3/uL (ref 150–450)
RBC: 4.54 x10E6/uL (ref 3.77–5.28)
RDW: 12.3 % (ref 11.7–15.4)
WBC: 4.8 10*3/uL (ref 3.4–10.8)

## 2021-12-01 LAB — VITAMIN D 25 HYDROXY (VIT D DEFICIENCY, FRACTURES): Vit D, 25-Hydroxy: 28.2 ng/mL — ABNORMAL LOW (ref 30.0–100.0)

## 2022-06-07 ENCOUNTER — Ambulatory Visit
Admission: RE | Admit: 2022-06-07 | Discharge: 2022-06-07 | Disposition: A | Discharge status: Home / Self Care | Payer: BC Managed Care – PPO | Source: Ambulatory Visit | Attending: Family Medicine | Admitting: Family Medicine

## 2022-06-07 DIAGNOSIS — Z1231 Encounter for screening mammogram for malignant neoplasm of breast: Secondary | ICD-10-CM | POA: Diagnosis present

## 2022-07-25 ENCOUNTER — Encounter: Payer: Self-pay | Admitting: Obstetrics and Gynecology

## 2022-07-25 ENCOUNTER — Ambulatory Visit (INDEPENDENT_AMBULATORY_CARE_PROVIDER_SITE_OTHER): Payer: BC Managed Care – PPO | Admitting: Obstetrics and Gynecology

## 2022-07-25 VITALS — BP 106/70 | Ht 66.0 in | Wt 128.0 lb

## 2022-07-25 DIAGNOSIS — Z01419 Encounter for gynecological examination (general) (routine) without abnormal findings: Secondary | ICD-10-CM

## 2022-07-25 DIAGNOSIS — Z1231 Encounter for screening mammogram for malignant neoplasm of breast: Secondary | ICD-10-CM

## 2022-07-25 NOTE — Patient Instructions (Signed)
I value your feedback and you entrusting us with your care. If you get a [facility] patient survey, I would appreciate you taking the time to let us know about your experience today. Thank you! ? ? ?

## 2022-07-25 NOTE — Progress Notes (Signed)
PCP: Dorcas Carrow, DO   Chief Complaint  Patient presents with   Gynecologic Exam    No concerns    HPI:      [name] is a 42 y.o. G2P1011 whose LMP was Patient's last menstrual period was 07/03/2022 (approximate)., presents today for her annual examination.  Her menses are regular every 28-30 days, lasting 3 days, light to mod flow.  No BTB, mild dysmen. No VS sx.   Sex activity: single partner, contraception - vasectomy. She does not have vaginal dryness. Hx of decreased libido--feels like it's this time in life due to being busy. Has researched meds and not interested in trying them.   Last Pap: 01/27/20 Results were: no abnormalities /neg HPV DNA.   Last mammogram: 06/07/22 Results were: normal--routine follow-up in 12 months There is no FH of breast cancer. There is no FH of ovarian cancer. The patient does do self-breast exams.  Tobacco use: The patient denies current or previous tobacco use. Alcohol use: 1 drink daily No drug use Exercise: min active  She does get adequate calcium and Vitamin D in her diet. Hx of vit d deficiency.   Labs with PCP.   Patient Active Problem List   Diagnosis Date Noted   Psoriasis 08/30/2017   History of gastric ulcer 01/03/2016    Past Surgical History:  Procedure Laterality Date   MELANOMA EXCISION Right     Family History  Problem Relation Age of Onset   Anemia Mother    Hyperlipidemia Father    Hypertension Father    Heart disease Maternal Grandmother    Aneurysm Paternal Grandmother        Brain   COPD Paternal Grandfather     Social History   Socioeconomic History   Marital status: Married    Spouse name: Not on file   Number of children: Not on file   Years of education: Not on file   Highest education level: Not on file  Occupational History   Not on file  Tobacco Use   Smoking status: Never   Smokeless tobacco: Never  Vaping Use   Vaping Use: Never used  Substance and Sexual  Activity   Alcohol use: Yes    Alcohol/week: 7.0 standard drinks of alcohol    Types: 7 Glasses of wine per week   Drug use: No   Sexual activity: Yes    Birth control/protection: Surgical    Comment: vasectomy  Other Topics Concern   Not on file  Social History Narrative   Not on file   Social Determinants of Health   Financial Resource Strain: Not on file  Food Insecurity: Not on file  Transportation Needs: Not on file  Physical Activity: Not on file  Stress: Not on file  Social Connections: Not on file  Intimate Partner Violence: Not on file     Current Outpatient Medications:    clobetasol (TEMOVATE) 0.05 % external solution, Apply topically., Disp: , Rfl:    magnesium 30 MG tablet, Take 30 mg by mouth 2 (two) times daily., Disp: , Rfl:    Multiple Vitamin (MULTIVITAMIN) tablet, Take 1 tablet by mouth daily., Disp: , Rfl:    VITAMIN D PO, Take by mouth., Disp: , Rfl:      ROS:  Review of Systems  Constitutional:  Negative for fatigue, fever and unexpected weight change.  Respiratory:  Negative for cough, shortness of breath and wheezing.   Cardiovascular:  Negative for chest pain, palpitations  and leg swelling.  Gastrointestinal:  Negative for blood in stool, constipation, diarrhea, nausea and vomiting.  Endocrine: Negative for cold intolerance, heat intolerance and polyuria.  Genitourinary:  Negative for dyspareunia, dysuria, flank pain, frequency, genital sores, hematuria, menstrual problem, pelvic pain, urgency, vaginal bleeding, vaginal discharge and vaginal pain.  Musculoskeletal:  Positive for arthralgias. Negative for back pain, joint swelling and myalgias.  Skin:  Negative for rash.  Neurological:  Negative for dizziness, syncope, light-headedness, numbness and headaches.  Hematological:  Negative for adenopathy.  Psychiatric/Behavioral:  Negative for agitation, confusion, sleep disturbance and suicidal ideas. The patient is not nervous/anxious.    BREAST:  No symptoms    Objective: BP 106/70   Ht 5\' 6"  (1.676 m)   Wt 128 lb (58.1 kg)   LMP 07/03/2022 (Approximate)   BMI 20.66 kg/m    Physical Exam Constitutional:      Appearance: She is well-developed.  Genitourinary:     Vulva normal.     Right Labia: No rash, tenderness or lesions.    Left Labia: No tenderness, lesions or rash.    No vaginal discharge, erythema or tenderness.      Right Adnexa: not tender and no mass present.    Left Adnexa: not tender and no mass present.    No cervical friability or polyp.     Uterus is not enlarged or tender.  Breasts:    Right: No mass, nipple discharge, skin change or tenderness.     Left: No mass, nipple discharge, skin change or tenderness.  Neck:     Thyroid: No thyromegaly.  Cardiovascular:     Rate and Rhythm: Normal rate and regular rhythm.     Heart sounds: Normal heart sounds. No murmur heard. Pulmonary:     Effort: Pulmonary effort is normal.     Breath sounds: Normal breath sounds.  Abdominal:     Palpations: Abdomen is soft.     Tenderness: There is no abdominal tenderness. There is no guarding or rebound.  Musculoskeletal:        General: Normal range of motion.     Cervical back: Normal range of motion.  Lymphadenopathy:     Cervical: No cervical adenopathy.  Neurological:     General: No focal deficit present.     Mental Status: She is alert and oriented to person, place, and time.     Cranial Nerves: No cranial nerve deficit.  Skin:    General: Skin is warm and dry.  Psychiatric:        Mood and Affect: Mood normal.        Behavior: Behavior normal.        Thought Content: Thought content normal.        Judgment: Judgment normal.  Vitals reviewed.    Assessment/Plan:  Encounter for annual routine gynecological examination  Encounter for screening mammogram for malignant neoplasm of breast; pt current on mammo          GYN counsel breast self exam, mammography screening, adequate intake of calcium  and vitamin D, diet and exercise    F/U  Return in about 1 year (around 07/25/2023).  Island Dohmen B. Alexx Giambra, PA-C 07/25/2022 4:28 PM

## 2022-11-06 ENCOUNTER — Encounter: Payer: Self-pay | Admitting: Obstetrics and Gynecology

## 2022-11-06 ENCOUNTER — Ambulatory Visit (INDEPENDENT_AMBULATORY_CARE_PROVIDER_SITE_OTHER): Payer: BC Managed Care – PPO | Admitting: Obstetrics and Gynecology

## 2022-11-06 VITALS — BP 118/72 | Ht 66.0 in | Wt 130.0 lb

## 2022-11-06 DIAGNOSIS — R102 Pelvic and perineal pain: Secondary | ICD-10-CM | POA: Diagnosis not present

## 2022-11-06 LAB — POCT URINALYSIS DIPSTICK
Bilirubin, UA: NEGATIVE
Blood, UA: NEGATIVE
Glucose, UA: NEGATIVE
Ketones, UA: NEGATIVE
Leukocytes, UA: NEGATIVE
Nitrite, UA: NEGATIVE
Protein, UA: NEGATIVE
Spec Grav, UA: 1.01 (ref 1.010–1.025)
pH, UA: 7 (ref 5.0–8.0)

## 2022-11-06 NOTE — Progress Notes (Signed)
Dorcas Carrow, DO   Chief Complaint  Patient presents with   Pelvic Pain    Mild cramping a week after cycle the last two months. Discomfort in pelvic area x 6 weeks.    HPI:      Ms. RUA LUCUS is a 42 y.o. G2P1011 whose LMP was Patient's last menstrual period was 10/20/2022 (exact date)., presents today for mild dysmen mid cycle lasting about 2 wks the past 2 cycles. Sx start about a week after periods start. Occas takes ibup with relief. Had also noticed LBP and went to chiro for adjustment who noticed her hip flexors were tight. Dysmen sx improved after she stretched them but then recurred next cycle. No real GI sx (maybe a little more gas), vag, or urin sx (feeling to urinate is slightly stronger with sx but no frequency/urgency) with dysmen. Hx of ovar cyst in past. Pt is sexually active, no pain/bleeding. Mensese are monthly, lasting 3 days, no BTB, no change in dysmen severity.    Patient Active Problem List   Diagnosis Date Noted   Psoriasis 08/30/2017   History of gastric ulcer 01/03/2016    Past Surgical History:  Procedure Laterality Date   MELANOMA EXCISION Right     Family History  Problem Relation Age of Onset   Anemia Mother    Hyperlipidemia Father    Hypertension Father    Heart disease Maternal Grandmother    Aneurysm Paternal Grandmother        Brain   COPD Paternal Grandfather     Social History   Socioeconomic History   Marital status: Married    Spouse name: Not on file   Number of children: Not on file   Years of education: Not on file   Highest education level: Not on file  Occupational History   Not on file  Tobacco Use   Smoking status: Never   Smokeless tobacco: Never  Vaping Use   Vaping status: Never Used  Substance and Sexual Activity   Alcohol use: Yes    Alcohol/week: 7.0 standard drinks of alcohol    Types: 7 Glasses of wine per week   Drug use: No   Sexual activity: Yes    Birth control/protection: Surgical     Comment: vasectomy  Other Topics Concern   Not on file  Social History Narrative   Not on file   Social Determinants of Health   Financial Resource Strain: Not on file  Food Insecurity: Not on file  Transportation Needs: Not on file  Physical Activity: Not on file  Stress: Not on file  Social Connections: Not on file  Intimate Partner Violence: Not on file    Outpatient Medications Prior to Visit  Medication Sig Dispense Refill   clobetasol (TEMOVATE) 0.05 % external solution Apply topically.     magnesium 30 MG tablet Take 30 mg by mouth 2 (two) times daily.     Multiple Vitamin (MULTIVITAMIN) tablet Take 1 tablet by mouth daily.     VITAMIN D PO Take by mouth.     No facility-administered medications prior to visit.      ROS:  Review of Systems  Constitutional:  Negative for fever.  Gastrointestinal:  Negative for blood in stool, constipation, diarrhea, nausea and vomiting.  Genitourinary:  Positive for pelvic pain. Negative for dyspareunia, dysuria, flank pain, frequency, hematuria, urgency, vaginal bleeding, vaginal discharge and vaginal pain.  Musculoskeletal:  Negative for back pain.  Skin:  Negative for rash.  BREAST: No symptoms   OBJECTIVE:   Vitals:  BP 118/72   Ht 5\' 6"  (1.676 m)   Wt 130 lb (59 kg)   LMP 10/20/2022 (Exact Date)   BMI 20.98 kg/m   Physical Exam Vitals reviewed.  Constitutional:      Appearance: She is well-developed.  Pulmonary:     Effort: Pulmonary effort is normal.  Abdominal:     Palpations: Abdomen is soft.     Tenderness: There is abdominal tenderness in the left lower quadrant. There is no guarding or rebound.    Genitourinary:    General: Normal vulva.     Pubic Area: No rash.      Labia:        Right: No rash, tenderness or lesion.        Left: No rash, tenderness or lesion.      Vagina: Normal. No vaginal discharge, erythema or tenderness.     Cervix: Normal.     Uterus: Normal. Not enlarged and not  tender.      Adnexa: Right adnexa normal.       Right: No mass or tenderness.         Left: Tenderness present. No mass.    Musculoskeletal:        General: Normal range of motion.     Cervical back: Normal range of motion.  Skin:    General: Skin is warm and dry.  Neurological:     General: No focal deficit present.     Mental Status: She is alert and oriented to person, place, and time.  Psychiatric:        Mood and Affect: Mood normal.        Behavior: Behavior normal.        Thought Content: Thought content normal.        Judgment: Judgment normal.     Results: Results for orders placed or performed in visit on 11/06/22 (from the past 24 hour(s))  POCT Urinalysis Dipstick     Status: Normal   Collection Time: 11/06/22  5:08 PM  Result Value Ref Range   Color, UA yellow    Clarity, UA clear    Glucose, UA Negative Negative   Bilirubin, UA neg    Ketones, UA neg    Spec Grav, UA 1.010 1.010 - 1.025   Blood, UA neg    pH, UA 7.0 5.0 - 8.0   Protein, UA Negative Negative   Urobilinogen, UA     Nitrite, UA neg    Leukocytes, UA Negative Negative   Appearance     Odor       Assessment/Plan: Pelvic cramping - Plan: POCT Urinalysis Dipstick, US PELVIS TRANSVAGINAL NON-OB (TV ONLY); neg UA, tender on exam. Check GYN u/s, if neg, most likely [facility]. Cont stretches. F/u sooner if sx worsen.    Return in about 1 week (around 11/13/2022) for GYN u/s for pelvic pain at [facility] to call pt.  Liseth Wann B. Latresa Gasser, PA-C 11/06/2022 5:11 PM

## 2022-11-24 ENCOUNTER — Other Ambulatory Visit: Payer: BC Managed Care – PPO

## 2022-12-04 ENCOUNTER — Ambulatory Visit: Payer: BC Managed Care – PPO | Admitting: Family Medicine

## 2022-12-04 ENCOUNTER — Encounter: Payer: Self-pay | Admitting: Family Medicine

## 2022-12-04 VITALS — BP 106/67 | HR 80 | Ht 66.0 in | Wt 130.2 lb

## 2022-12-04 DIAGNOSIS — E559 Vitamin D deficiency, unspecified: Secondary | ICD-10-CM

## 2022-12-04 DIAGNOSIS — Z0000 Encounter for general adult medical examination without abnormal findings: Secondary | ICD-10-CM

## 2022-12-04 DIAGNOSIS — Z1231 Encounter for screening mammogram for malignant neoplasm of breast: Secondary | ICD-10-CM | POA: Diagnosis not present

## 2022-12-04 DIAGNOSIS — Z23 Encounter for immunization: Secondary | ICD-10-CM | POA: Diagnosis not present

## 2022-12-04 MED ORDER — CYCLOBENZAPRINE HCL 5 MG PO TABS: 5.0000 mg | ORAL_TABLET | Freq: Every evening | ORAL | 1 refills | Status: DC | PRN

## 2022-12-04 NOTE — Assessment & Plan Note (Signed)
Rechecking labs today. Await results. Treat as needed.  °

## 2022-12-04 NOTE — Patient Instructions (Signed)
Please call to schedule your mammogram and/or bone density: [facility]  Address: [address], [place], [place] [address] Phone: [phone]  [facility] [place] [address] [place],  [place]  [address] Phone: [phone]   

## 2022-12-04 NOTE — Progress Notes (Signed)
BP 106/67   Pulse 80   Ht 5\' 6"  (1.676 m)   Wt 130 lb 3.2 oz (59.1 kg)   LMP 10/20/2022 (Exact Date)   SpO2 98%   BMI 21.01 kg/m    Subjective:    Patient ID: Debbie Green, female    DOB: 1980/03/31, 42 y.o.   MRN: [id number]  HPI: Debbie Green is a 42 y.o. female presenting on 12/04/2022 for comprehensive medical examination. Current medical complaints include:  Had some midcycle pain and bloating with her menstrual cycle. She is working with her GYN about this and it seems to have resolved.  Issues with hemorrhoids where she sees a line on her stool. She is not seeing blood, but it is swollen.   Body has been really tight when she is trying to sleep.   She currently lives with: husband and kids Menopausal Symptoms: no  Depression Screen done today and results listed below:     12/04/2022    8:14 AM 11/30/2021    8:14 AM 03/01/2021    9:45 AM 11/29/2020    1:04 PM 12/01/2019    8:06 AM  Depression screen PHQ 2/9  Decreased Interest 0 0 0 0 0  Down, Depressed, Hopeless 0 0 0 0 0  PHQ - 2 Score 0 0 0 0 0  Altered sleeping 0 0 0 0   Tired, decreased energy 0 1 0 0   Change in appetite 0 0 0 0   Feeling bad or failure about yourself  0 0 0 0   Trouble concentrating 0 0 0 0   Moving slowly or fidgety/restless 0 0 0 0   Suicidal thoughts 0 0 0 0   PHQ-9 Score 0 1 0 0   Difficult doing work/chores Not difficult at all Not difficult at all Not difficult at all      Past Medical History:  Past Medical History:  Diagnosis Date   Anxiety    Bleeding ulcer 2012   Cancer [facility])    Melanoma (HCC) 01/2019   right thigh    Surgical History:  Past Surgical History:  Procedure Laterality Date   MELANOMA EXCISION Right     Medications:  Current Outpatient Medications on File Prior to Visit  Medication Sig   clobetasol (TEMOVATE) 0.05 % external solution Apply topically.   magnesium 30 MG tablet Take 30 mg by mouth 2 (two) times daily.   Multiple  Vitamin (MULTIVITAMIN) tablet Take 1 tablet by mouth daily.   VITAMIN D PO Take by mouth.   No current facility-administered medications on file prior to visit.    Allergies:  No Known Allergies  Social History:  Social History   Socioeconomic History   Marital status: Married    Spouse name: Not on file   Number of children: Not on file   Years of education: Not on file   Highest education level: Not on file  Occupational History   Not on file  Tobacco Use   Smoking status: Never   Smokeless tobacco: Never  Vaping Use   Vaping status: Never Used  Substance and Sexual Activity   Alcohol use: Yes    Alcohol/week: 7.0 standard drinks of alcohol    Types: 7 Glasses of wine per week   Drug use: No   Sexual activity: Yes    Birth control/protection: Surgical    Comment: vasectomy  Other Topics Concern   Not on file  Social History Narrative   Not on  file   Social Determinants of Health   Financial Resource Strain: Not on file  Food Insecurity: Not on file  Transportation Needs: Not on file  Physical Activity: Not on file  Stress: Not on file  Social Connections: Not on file  Intimate Partner Violence: Not on file   Social History   Tobacco Use  Smoking Status Never  Smokeless Tobacco Never   Social History   Substance and Sexual Activity  Alcohol Use Yes   Alcohol/week: 7.0 standard drinks of alcohol   Types: 7 Glasses of wine per week    Family History:  Family History  Problem Relation Age of Onset   Anemia Mother    Hyperlipidemia Father    Hypertension Father    Heart disease Maternal Grandmother    Aneurysm Paternal Grandmother        Brain   COPD Paternal Grandfather     Past medical history, surgical history, medications, allergies, family history and social history reviewed with patient today and changes made to appropriate areas of the chart.   Review of Systems  Constitutional: Negative.   HENT: Negative.    Eyes: Negative.    Respiratory: Negative.    Cardiovascular:  Positive for palpitations. Negative for chest pain, orthopnea, claudication, leg swelling and PND.  Gastrointestinal: Negative.   Genitourinary: Negative.   Musculoskeletal:  Positive for myalgias. Negative for back pain, falls, joint pain and neck pain.  Skin: Negative.   Neurological: Negative.   Endo/Heme/Allergies:  Positive for environmental allergies (vrey minor). Negative for polydipsia. Bruises/bleeds easily.  Psychiatric/Behavioral: Negative.     All other ROS negative except what is listed above and in the HPI.      Objective:    BP 106/67   Pulse 80   Ht 5\' 6"  (1.676 m)   Wt 130 lb 3.2 oz (59.1 kg)   LMP 10/20/2022 (Exact Date)   SpO2 98%   BMI 21.01 kg/m   Wt Readings from Last 3 Encounters:  12/04/22 130 lb 3.2 oz (59.1 kg)  11/06/22 130 lb (59 kg)  07/25/22 128 lb (58.1 kg)    Physical Exam Vitals and nursing note reviewed.  Constitutional:      General: She is not in acute distress.    Appearance: Normal appearance. She is not ill-appearing, toxic-appearing or diaphoretic.  HENT:     Head: Normocephalic and atraumatic.     Right Ear: Tympanic membrane, ear canal and external ear normal. There is no impacted cerumen.     Left Ear: Tympanic membrane, ear canal and external ear normal. There is no impacted cerumen.     Nose: Nose normal. No congestion or rhinorrhea.     Mouth/Throat:     Mouth: Mucous membranes are moist.     Pharynx: Oropharynx is clear. No oropharyngeal exudate or posterior oropharyngeal erythema.  Eyes:     General: No scleral icterus.       Right eye: No discharge.        Left eye: No discharge.     Extraocular Movements: Extraocular movements intact.     Conjunctiva/sclera: Conjunctivae normal.     Pupils: Pupils are equal, round, and reactive to light.  Neck:     Vascular: No carotid bruit.  Cardiovascular:     Rate and Rhythm: Normal rate and regular rhythm.     Pulses: Normal pulses.      Heart sounds: No murmur heard.    No friction rub. No gallop.  Pulmonary:  Effort: Pulmonary effort is normal. No respiratory distress.     Breath sounds: Normal breath sounds. No stridor. No wheezing, rhonchi or rales.  Chest:     Chest wall: No tenderness.  Abdominal:     General: Abdomen is flat. Bowel sounds are normal. There is no distension.     Palpations: Abdomen is soft. There is no mass.     Tenderness: There is no abdominal tenderness. There is no right CVA tenderness, left CVA tenderness, guarding or rebound.     Hernia: No hernia is present.  Genitourinary:    Comments: Breast and pelvic exams deferred with shared decision making Musculoskeletal:        General: No swelling, tenderness, deformity or signs of injury.     Cervical back: Normal range of motion and neck supple. No rigidity. No muscular tenderness.     Right lower leg: No edema.     Left lower leg: No edema.  Lymphadenopathy:     Cervical: No cervical adenopathy.  Skin:    General: Skin is warm and dry.     Capillary Refill: Capillary refill takes less than 2 seconds.     Coloration: Skin is not jaundiced or pale.     Findings: No bruising, erythema, lesion or rash.  Neurological:     General: No focal deficit present.     Mental Status: She is alert and oriented to person, place, and time. Mental status is at baseline.     Cranial Nerves: No cranial nerve deficit.     Sensory: No sensory deficit.     Motor: No weakness.     Coordination: Coordination normal.     Gait: Gait normal.     Deep Tendon Reflexes: Reflexes normal.  Psychiatric:        Mood and Affect: Mood normal.        Behavior: Behavior normal.        Thought Content: Thought content normal.        Judgment: Judgment normal.     Results for orders placed or performed in visit on 11/06/22  POCT Urinalysis Dipstick  Result Value Ref Range   Color, UA yellow    Clarity, UA clear    Glucose, UA Negative Negative   Bilirubin,  UA neg    Ketones, UA neg    Spec Grav, UA 1.010 1.010 - 1.025   Blood, UA neg    pH, UA 7.0 5.0 - 8.0   Protein, UA Negative Negative   Urobilinogen, UA     Nitrite, UA neg    Leukocytes, UA Negative Negative   Appearance     Odor        Assessment & Plan:   Problem List Items Addressed This Visit       Other   Vitamin D deficiency    Rechecking labs today. Await results. Treat as needed.       Relevant Orders   VITAMIN D 25 Hydroxy (Vit-D Deficiency, Fractures)   Other Visit Diagnoses     Routine general medical examination at a health care facility    -  Primary   Vaccines up to date. Screening labs checked today. Pap and mammo up to date. Continue diet and exercise. Call with any concerns.   Relevant Orders   CBC with Differential/Platelet   Comprehensive metabolic panel   Lipid Panel w/o Chol/HDL Ratio   TSH   Magnesium   Encounter for screening mammogram for malignant neoplasm of breast  Mammo ordered today   Relevant Orders   MM 3D SCREENING MAMMOGRAM BILATERAL BREAST   Needs flu shot       Flu shot given today   Relevant Orders   Flu vaccine trivalent PF, 6mos and older(Flulaval,Afluria,Fluarix,Fluzone) (Completed)        Follow up plan: Return in about 1 year (around 12/04/2023) for physical.   LABORATORY TESTING:  - Pap smear: up to date  IMMUNIZATIONS:   - Tdap: Tetanus vaccination status reviewed: last tetanus booster within 10 years. - Influenza: Up to date - Pneumovax: Refused - Prevnar: Not applicable - COVID: Refused - HPV: Not applicable - Shingrix vaccine: Not applicable  SCREENING: -Mammogram: Ordered today   PATIENT COUNSELING:   Advised to take 1 mg of folate supplement per day if capable of pregnancy.   Sexuality: Discussed sexually transmitted diseases, partner selection, use of condoms, avoidance of unintended pregnancy  and contraceptive alternatives.   Advised to avoid cigarette smoking.  I discussed with the  patient that most people either abstain from alcohol or drink within safe limits (<=14/week and <=4 drinks/occasion for males, <=7/weeks and <= 3 drinks/occasion for females) and that the risk for alcohol disorders and other health effects rises proportionally with the number of drinks per week and how often a drinker exceeds daily limits.  Discussed cessation/primary prevention of drug use and availability of treatment for abuse.   Diet: Encouraged to adjust caloric intake to maintain  or achieve ideal body weight, to reduce intake of dietary saturated fat and total fat, to limit sodium intake by avoiding high sodium foods and not adding table salt, and to maintain adequate dietary potassium and calcium preferably from fresh fruits, vegetables, and low-fat dairy products.    stressed the importance of regular exercise  Injury prevention: Discussed safety belts, safety helmets, smoke detector, smoking near bedding or upholstery.   Dental health: Discussed importance of regular tooth brushing, flossing, and dental visits.    NEXT PREVENTATIVE PHYSICAL DUE IN 1 YEAR. Return in about 1 year (around 12/04/2023) for physical.

## 2022-12-05 LAB — LIPID PANEL W/O CHOL/HDL RATIO
Cholesterol, Total: 195 mg/dL (ref 100–199)
HDL: 76 mg/dL (ref >39)
LDL Chol Calc (NIH): 102 mg/dL — ABNORMAL HIGH (ref 0–99)
Triglycerides: 96 mg/dL (ref 0–149)
VLDL Cholesterol Cal: 17 mg/dL (ref 5–40)

## 2022-12-05 LAB — COMPREHENSIVE METABOLIC PANEL
ALT: 13 [IU]/L (ref 0–32)
AST: 17 [IU]/L (ref 0–40)
Albumin: 4.2 g/dL (ref 3.9–4.9)
Alkaline Phosphatase: 74 [IU]/L (ref 44–121)
BUN/Creatinine Ratio: 15 (ref 9–23)
BUN: 13 mg/dL (ref 6–24)
Bilirubin Total: 0.5 mg/dL (ref 0.0–1.2)
CO2: 22 mmol/L (ref 20–29)
Calcium: 8.8 mg/dL (ref 8.7–10.2)
Chloride: 105 mmol/L (ref 96–106)
Creatinine, Ser: 0.86 mg/dL (ref 0.57–1.00)
Globulin, Total: 2.2 g/dL (ref 1.5–4.5)
Glucose: 84 mg/dL (ref 70–99)
Potassium: 4.1 mmol/L (ref 3.5–5.2)
Sodium: 140 mmol/L (ref 134–144)
Total Protein: 6.4 g/dL (ref 6.0–8.5)
eGFR: 87 mL/min/{1.73_m2} (ref >59)

## 2022-12-05 LAB — CBC WITH DIFFERENTIAL/PLATELET
Basophils Absolute: 0 10*3/uL (ref 0.0–0.2)
Basos: 1 %
EOS (ABSOLUTE): 0.1 10*3/uL (ref 0.0–0.4)
Eos: 2 %
Hematocrit: 40.4 % (ref 34.0–46.6)
Hemoglobin: 13 g/dL (ref 11.1–15.9)
Immature Grans (Abs): 0 10*3/uL (ref 0.0–0.1)
Immature Granulocytes: 0 %
Lymphocytes Absolute: 1.2 10*3/uL (ref 0.7–3.1)
Lymphs: 24 %
MCH: 30.1 pg (ref 26.6–33.0)
MCHC: 32.2 g/dL (ref 31.5–35.7)
MCV: 94 fL (ref 79–97)
Monocytes Absolute: 0.6 10*3/uL (ref 0.1–0.9)
Monocytes: 11 %
Neutrophils Absolute: 3.3 10*3/uL (ref 1.4–7.0)
Neutrophils: 62 %
Platelets: 254 10*3/uL (ref 150–450)
RBC: 4.32 x10E6/uL (ref 3.77–5.28)
RDW: 11.8 % (ref 11.7–15.4)
WBC: 5.3 10*3/uL (ref 3.4–10.8)

## 2022-12-05 LAB — VITAMIN D 25 HYDROXY (VIT D DEFICIENCY, FRACTURES): Vit D, 25-Hydroxy: 26.1 ng/mL — ABNORMAL LOW (ref 30.0–100.0)

## 2022-12-05 LAB — TSH: TSH: 1.17 u[IU]/mL (ref 0.450–4.500)

## 2022-12-05 LAB — MAGNESIUM: Magnesium: 2.2 mg/dL (ref 1.6–2.3)

## 2023-11-14 ENCOUNTER — Other Ambulatory Visit: Payer: Self-pay | Admitting: Family Medicine

## 2023-11-14 DIAGNOSIS — Z1231 Encounter for screening mammogram for malignant neoplasm of breast: Secondary | ICD-10-CM

## 2023-12-05 ENCOUNTER — Encounter: Payer: Self-pay | Admitting: Family Medicine

## 2023-12-11 ENCOUNTER — Ambulatory Visit
Admission: RE | Admit: 2023-12-11 | Discharge: 2023-12-11 | Disposition: A | Discharge status: Home / Self Care | Source: Ambulatory Visit | Attending: Family Medicine | Admitting: Family Medicine

## 2023-12-11 DIAGNOSIS — Z1231 Encounter for screening mammogram for malignant neoplasm of breast: Secondary | ICD-10-CM | POA: Diagnosis present

## 2023-12-13 ENCOUNTER — Other Ambulatory Visit: Payer: Self-pay | Admitting: Family Medicine

## 2023-12-13 DIAGNOSIS — R928 Other abnormal and inconclusive findings on diagnostic imaging of breast: Secondary | ICD-10-CM

## 2023-12-18 ENCOUNTER — Ambulatory Visit
Admission: RE | Admit: 2023-12-18 | Discharge: 2023-12-18 | Disposition: A | Discharge status: Home / Self Care | Source: Ambulatory Visit | Attending: Family Medicine | Admitting: Family Medicine

## 2023-12-18 DIAGNOSIS — R928 Other abnormal and inconclusive findings on diagnostic imaging of breast: Secondary | ICD-10-CM | POA: Insufficient documentation

## 2023-12-21 ENCOUNTER — Ambulatory Visit: Admitting: Family Medicine

## 2023-12-24 ENCOUNTER — Ambulatory Visit: Payer: Self-pay | Admitting: Family Medicine

## 2024-01-15 ENCOUNTER — Ambulatory Visit: Admitting: Family Medicine

## 2024-01-15 VITALS — BP 123/77 | HR 64 | Temp 98.0°F | Ht 66.0 in | Wt 124.6 lb

## 2024-01-15 DIAGNOSIS — L409 Psoriasis, unspecified: Secondary | ICD-10-CM

## 2024-01-15 DIAGNOSIS — Z0000 Encounter for general adult medical examination without abnormal findings: Secondary | ICD-10-CM

## 2024-01-15 NOTE — Progress Notes (Signed)
 BP 123/77   Pulse 64   Temp 98 F (36.7 C) (Oral)   Ht 5' 6 (1.676 m)   Wt 124 lb 9.6 oz (56.5 kg)   LMP 01/07/2024 (Exact Date)   SpO2 100%   BMI 20.11 kg/m    Subjective:    Patient ID: Debbie Green, female    DOB: 09/15/1980, 43 y.o.   MRN: [id number]  HPI: Debbie Green is a 43 y.o. female presenting on 01/15/2024 for comprehensive medical examination. Current medical complaints include: Working on trying to be healthy.   She currently lives with: husband and kids Menopausal Symptoms: no  Depression Screen done today and results listed below:     01/15/2024    1:17 PM 12/04/2022    8:14 AM 11/30/2021    8:14 AM 03/01/2021    9:45 AM 11/29/2020    1:04 PM  Depression screen PHQ 2/9  Decreased Interest 0 0 0 0 0  Down, Depressed, Hopeless 0 0 0 0 0  PHQ - 2 Score 0 0 0 0 0  Altered sleeping 0 0 0 0 0  Tired, decreased energy 0 0 1 0 0  Change in appetite 0 0 0 0 0  Feeling bad or failure about yourself  0 0 0 0 0  Trouble concentrating 0 0 0 0 0  Moving slowly or fidgety/restless 0 0 0 0 0  Suicidal thoughts 0 0 0 0 0  PHQ-9 Score 0 0  1  0  0   Difficult doing work/chores  Not difficult at all Not difficult at all Not difficult at all      Data saved with a previous flowsheet row definition    Past Medical History:  Past Medical History:  Diagnosis Date   Allergy    Anxiety    Bleeding ulcer 2012   Blood transfusion without reported diagnosis 03/27/2010   Ulcer   Cancer (HCC)    Melanoma (HCC) 01/2019   right thigh   Ulcer 03/25/2010    Surgical History:  Past Surgical History:  Procedure Laterality Date   MELANOMA EXCISION Right     Medications:  Current Outpatient Medications on File Prior to Visit  Medication Sig   clobetasol (TEMOVATE) 0.05 % external solution Apply topically.   magnesium 30 MG tablet Take 30 mg by mouth 2 (two) times daily.   Multiple Vitamin (MULTIVITAMIN) tablet Take 1 tablet by mouth daily.   omega-3 acid  ethyl esters (LOVAZA) 1 g capsule Take 2 capsules by mouth 2 (two) times daily.   VITAMIN D  PO Take by mouth.   No current facility-administered medications on file prior to visit.    Allergies:  No Known Allergies  Social History:  Social History   Socioeconomic History   Marital status: Married    Spouse name: Not on file   Number of children: Not on file   Years of education: Not on file   Highest education level: Master's degree (e.g., MA, MS, MEng, MEd, MSW, MBA)  Occupational History   Not on file  Tobacco Use   Smoking status: Never   Smokeless tobacco: Never  Vaping Use   Vaping status: Never Used  Substance and Sexual Activity   Alcohol use: Yes    Alcohol/week: 7.0 standard drinks of alcohol    Types: 7 Glasses of wine per week   Drug use: No   Sexual activity: Yes    Birth control/protection: Surgical    Comment: vasectomy  Other  Topics Concern   Not on file  Social History Narrative   Not on file   Social Drivers of Health   Financial Resource Strain: Low Risk  (01/15/2024)   Overall Financial Resource Strain (CARDIA)    Difficulty of Paying Living Expenses: Not hard at all  Food Insecurity: No Food Insecurity (01/15/2024)   Hunger Vital Sign    Worried About Running Out of Food in the Last Year: Never true    Ran Out of Food in the Last Year: Never true  Transportation Needs: No Transportation Needs (01/15/2024)   PRAPARE - Administrator, Civil Service (Medical): No    Lack of Transportation (Non-Medical): No  Physical Activity: Insufficiently Active (01/15/2024)   Exercise Vital Sign    Days of Exercise per Week: 3 days    Minutes of Exercise per Session: 20 min  Stress: No Stress Concern Present (01/15/2024)   [employer] of Occupational Health - Occupational Stress Questionnaire    Feeling of Stress: Not at all  Social Connections: Socially Integrated (01/15/2024)   Social Connection and Isolation Panel    Frequency of  Communication with Friends and Family: Once a week    Frequency of Social Gatherings with Friends and Family: Twice a week    Attends Religious Services: More than 4 times per year    Active Member of [employer] or Organizations: Yes    Attends Engineer, Structural: More than 4 times per year    Marital Status: Married  Catering Manager Violence: Not At Risk (01/15/2024)   Humiliation, Afraid, Rape, and Kick questionnaire    Fear of Current or Ex-Partner: No    Emotionally Abused: No    Physically Abused: No    Sexually Abused: No   Social History   Tobacco Use  Smoking Status Never  Smokeless Tobacco Never   Social History   Substance and Sexual Activity  Alcohol Use Yes   Alcohol/week: 7.0 standard drinks of alcohol   Types: 7 Glasses of wine per week    Family History:  Family History  Problem Relation Age of Onset   Anemia Mother    Hyperlipidemia Father    Hypertension Father    Alcohol abuse Father    Depression Father    Heart disease Maternal Grandmother    Aneurysm Paternal Grandmother        Brain   COPD Paternal Grandfather     Past medical history, surgical history, medications, allergies, family history and social history reviewed with patient today and changes made to appropriate areas of the chart.   Review of Systems  Constitutional: Negative.   HENT: Negative.    Eyes: Negative.   Respiratory: Negative.    Cardiovascular:  Positive for palpitations (rarely). Negative for chest pain, orthopnea, claudication, leg swelling and PND.  Gastrointestinal: Negative.   Genitourinary: Negative.   Musculoskeletal: Negative.   Skin: Negative.   Neurological: Negative.   Endo/Heme/Allergies:  Negative for environmental allergies and polydipsia. Bruises/bleeds easily.  Psychiatric/Behavioral: Negative.     All other ROS negative except what is listed above and in the HPI.      Objective:    BP 123/77   Pulse 64   Temp 98 F (36.7 C) (Oral)   Ht  5' 6 (1.676 m)   Wt 124 lb 9.6 oz (56.5 kg)   LMP 01/07/2024 (Exact Date)   SpO2 100%   BMI 20.11 kg/m   Wt Readings from Last 3 Encounters:  01/15/24 124  lb 9.6 oz (56.5 kg)  12/04/22 130 lb 3.2 oz (59.1 kg)  11/06/22 130 lb (59 kg)    Physical Exam Vitals and nursing note reviewed.  Constitutional:      General: She is not in acute distress.    Appearance: Normal appearance. She is normal weight. She is not ill-appearing, toxic-appearing or diaphoretic.  HENT:     Head: Normocephalic and atraumatic.     Right Ear: Tympanic membrane, ear canal and external ear normal. There is no impacted cerumen.     Left Ear: Tympanic membrane, ear canal and external ear normal. There is no impacted cerumen.     Nose: Nose normal. No congestion or rhinorrhea.     Mouth/Throat:     Mouth: Mucous membranes are moist.     Pharynx: Oropharynx is clear. No oropharyngeal exudate or posterior oropharyngeal erythema.  Eyes:     General: No scleral icterus.       Right eye: No discharge.        Left eye: No discharge.     Extraocular Movements: Extraocular movements intact.     Conjunctiva/sclera: Conjunctivae normal.     Pupils: Pupils are equal, round, and reactive to light.  Neck:     Vascular: No carotid bruit.  Cardiovascular:     Rate and Rhythm: Normal rate and regular rhythm.     Pulses: Normal pulses.     Heart sounds: No murmur heard.    No friction rub. No gallop.  Pulmonary:     Effort: Pulmonary effort is normal. No respiratory distress.     Breath sounds: Normal breath sounds. No stridor. No wheezing, rhonchi or rales.  Chest:     Chest wall: No tenderness.  Abdominal:     General: Abdomen is flat. Bowel sounds are normal. There is no distension.     Palpations: Abdomen is soft. There is no mass.     Tenderness: There is no abdominal tenderness. There is no right CVA tenderness, left CVA tenderness, guarding or rebound.     Hernia: No hernia is present.  Genitourinary:     Comments: Breast and pelvic exams deferred with shared decision making Musculoskeletal:        General: No swelling, tenderness, deformity or signs of injury.     Cervical back: Normal range of motion and neck supple. No rigidity. No muscular tenderness.     Right lower leg: No edema.     Left lower leg: No edema.  Lymphadenopathy:     Cervical: No cervical adenopathy.  Skin:    General: Skin is warm and dry.     Capillary Refill: Capillary refill takes less than 2 seconds.     Coloration: Skin is not jaundiced or pale.     Findings: No bruising, erythema, lesion or rash.  Neurological:     General: No focal deficit present.     Mental Status: She is alert and oriented to person, place, and time. Mental status is at baseline.     Cranial Nerves: No cranial nerve deficit.     Sensory: No sensory deficit.     Motor: No weakness.     Coordination: Coordination normal.     Gait: Gait normal.     Deep Tendon Reflexes: Reflexes normal.  Psychiatric:        Mood and Affect: Mood normal.        Behavior: Behavior normal.        Thought Content: Thought content normal.  Judgment: Judgment normal.     Results for orders placed or performed in visit on 12/04/22  CBC with Differential/Platelet   Collection Time: 12/04/22  8:45 AM  Result Value Ref Range   WBC 5.3 3.4 - 10.8 x10E3/uL   RBC 4.32 3.77 - 5.28 x10E6/uL   Hemoglobin 13.0 11.1 - 15.9 g/dL   Hematocrit 59.5 65.9 - 46.6 %   MCV 94 79 - 97 fL   MCH 30.1 26.6 - 33.0 pg   MCHC 32.2 31.5 - 35.7 g/dL   RDW 88.1 88.2 - 84.5 %   Platelets 254 150 - 450 x10E3/uL   Neutrophils 62 Not Estab. %   Lymphs 24 Not Estab. %   Monocytes 11 Not Estab. %   Eos 2 Not Estab. %   Basos 1 Not Estab. %   Neutrophils Absolute 3.3 1.4 - 7.0 x10E3/uL   Lymphocytes Absolute 1.2 0.7 - 3.1 x10E3/uL   Monocytes Absolute 0.6 0.1 - 0.9 x10E3/uL   EOS (ABSOLUTE) 0.1 0.0 - 0.4 x10E3/uL   Basophils Absolute 0.0 0.0 - 0.2 x10E3/uL   Immature  Granulocytes 0 Not Estab. %   Immature Grans (Abs) 0.0 0.0 - 0.1 x10E3/uL  Comprehensive metabolic panel   Collection Time: 12/04/22  8:45 AM  Result Value Ref Range   Glucose 84 70 - 99 mg/dL   BUN 13 6 - 24 mg/dL   Creatinine, Ser 9.13 0.57 - 1.00 mg/dL   eGFR 87 >40 fO/fpw/8.26   BUN/Creatinine Ratio 15 9 - 23   Sodium 140 134 - 144 mmol/L   Potassium 4.1 3.5 - 5.2 mmol/L   Chloride 105 96 - 106 mmol/L   CO2 22 20 - 29 mmol/L   Calcium 8.8 8.7 - 10.2 mg/dL   Total Protein 6.4 6.0 - 8.5 g/dL   Albumin 4.2 3.9 - 4.9 g/dL   Globulin, Total 2.2 1.5 - 4.5 g/dL   Bilirubin Total 0.5 0.0 - 1.2 mg/dL   Alkaline Phosphatase 74 44 - 121 IU/L   AST 17 0 - 40 IU/L   ALT 13 0 - 32 IU/L  Lipid Panel w/o Chol/HDL Ratio   Collection Time: 12/04/22  8:45 AM  Result Value Ref Range   Cholesterol, Total 195 100 - 199 mg/dL   Triglycerides 96 0 - 149 mg/dL   HDL 76 >60 mg/dL   VLDL Cholesterol Cal 17 5 - 40 mg/dL   LDL Chol Calc (NIH) 897 (H) 0 - 99 mg/dL  TSH   Collection Time: 12/04/22  8:45 AM  Result Value Ref Range   TSH 1.170 0.450 - 4.500 uIU/mL  Magnesium   Collection Time: 12/04/22  8:45 AM  Result Value Ref Range   Magnesium 2.2 1.6 - 2.3 mg/dL  VITAMIN D  25 Hydroxy (Vit-D Deficiency, Fractures)   Collection Time: 12/04/22  8:45 AM  Result Value Ref Range   Vit D, 25-Hydroxy 26.1 (L) 30.0 - 100.0 ng/mL      Assessment & Plan:   Problem List Items Addressed This Visit       Musculoskeletal and Integument   Psoriasis   Considering labs- will message if she'd like to do some. Have a great day!      Other Visit Diagnoses       Routine general medical examination at a health care facility    -  Primary   Vaccines up to date/declined. Screening labs checked today. Mammogram up to date. Continue diet and exercise. Call with any concerns.  Relevant Orders   CBC with Differential/Platelet   Comprehensive metabolic panel with GFR   Lipid Panel w/o Chol/HDL Ratio   TSH    VITAMIN D  25 Hydroxy (Vit-D Deficiency, Fractures)   Hepatitis B surface antibody,quantitative        Follow up plan: Return in about 1 year (around 01/14/2025) for physical.   LABORATORY TESTING:  - Pap smear: up to date  IMMUNIZATIONS:   - Tdap: Tetanus vaccination status reviewed: Tdap vaccination indicated and given today. - Influenza: Refused - Prevnar: Not applicable - COVID: Refused - HPV: Not applicable - Shingrix vaccine: Not applicable  SCREENING: -Mammogram: Up to date   PATIENT COUNSELING:   Advised to take 1 mg of folate supplement per day if capable of pregnancy.   Sexuality: Discussed sexually transmitted diseases, partner selection, use of condoms, avoidance of unintended pregnancy  and contraceptive alternatives.   Advised to avoid cigarette smoking.  I discussed with the patient that most people either abstain from alcohol or drink within safe limits (<=14/week and <=4 drinks/occasion for males, <=7/weeks and <= 3 drinks/occasion for females) and that the risk for alcohol disorders and other health effects rises proportionally with the number of drinks per week and how often a drinker exceeds daily limits.  Discussed cessation/primary prevention of drug use and availability of treatment for abuse.   Diet: Encouraged to adjust caloric intake to maintain  or achieve ideal body weight, to reduce intake of dietary saturated fat and total fat, to limit sodium intake by avoiding high sodium foods and not adding table salt, and to maintain adequate dietary potassium and calcium preferably from fresh fruits, vegetables, and low-fat dairy products.    stressed the importance of regular exercise  Injury prevention: Discussed safety belts, safety helmets, smoke detector, smoking near bedding or upholstery.   Dental health: Discussed importance of regular tooth brushing, flossing, and dental visits.    NEXT PREVENTATIVE PHYSICAL DUE IN 1 YEAR. Return in about 1  year (around 01/14/2025) for physical.

## 2024-01-15 NOTE — Assessment & Plan Note (Signed)
 Considering labs- will message if she'd like to do some. Have a great day!

## 2024-01-16 ENCOUNTER — Ambulatory Visit: Payer: Self-pay | Admitting: Family Medicine

## 2024-01-16 LAB — CBC WITH DIFFERENTIAL/PLATELET
Basophils Absolute: 0.1 x10E3/uL (ref 0.0–0.2)
Basos: 1 %
EOS (ABSOLUTE): 0.1 x10E3/uL (ref 0.0–0.4)
Eos: 2 %
Hematocrit: 43 % (ref 34.0–46.6)
Hemoglobin: 13.7 g/dL (ref 11.1–15.9)
Immature Grans (Abs): 0 x10E3/uL (ref 0.0–0.1)
Immature Granulocytes: 0 %
Lymphocytes Absolute: 1.7 x10E3/uL (ref 0.7–3.1)
Lymphs: 29 %
MCH: 30 pg (ref 26.6–33.0)
MCHC: 31.9 g/dL (ref 31.5–35.7)
MCV: 94 fL (ref 79–97)
Monocytes Absolute: 0.7 x10E3/uL (ref 0.1–0.9)
Monocytes: 11 %
Neutrophils Absolute: 3.3 x10E3/uL (ref 1.4–7.0)
Neutrophils: 57 %
Platelets: 311 x10E3/uL (ref 150–450)
RBC: 4.57 x10E6/uL (ref 3.77–5.28)
RDW: 12.1 % (ref 11.7–15.4)
WBC: 5.8 x10E3/uL (ref 3.4–10.8)

## 2024-01-16 LAB — COMPREHENSIVE METABOLIC PANEL WITH GFR
ALT: 33 IU/L — ABNORMAL HIGH (ref 0–32)
AST: 31 IU/L (ref 0–40)
Albumin: 5.2 g/dL — ABNORMAL HIGH (ref 3.9–4.9)
Alkaline Phosphatase: 98 IU/L (ref 41–116)
BUN/Creatinine Ratio: 12 (ref 9–23)
BUN: 10 mg/dL (ref 6–24)
Bilirubin Total: 0.7 mg/dL (ref 0.0–1.2)
CO2: 21 mmol/L (ref 20–29)
Calcium: 9.5 mg/dL (ref 8.7–10.2)
Chloride: 101 mmol/L (ref 96–106)
Creatinine, Ser: 0.83 mg/dL (ref 0.57–1.00)
Globulin, Total: 1.6 g/dL (ref 1.5–4.5)
Glucose: 86 mg/dL (ref 70–99)
Potassium: 4.5 mmol/L (ref 3.5–5.2)
Sodium: 139 mmol/L (ref 134–144)
Total Protein: 6.8 g/dL (ref 6.0–8.5)
eGFR: 90 mL/min/1.73 (ref >59)

## 2024-01-16 LAB — VITAMIN D 25 HYDROXY (VIT D DEFICIENCY, FRACTURES): Vit D, 25-Hydroxy: 60.9 ng/mL (ref 30.0–100.0)

## 2024-01-16 LAB — LIPID PANEL W/O CHOL/HDL RATIO
Cholesterol, Total: 271 mg/dL — ABNORMAL HIGH (ref 100–199)
HDL: 100 mg/dL (ref >39)
LDL Chol Calc (NIH): 163 mg/dL — ABNORMAL HIGH (ref 0–99)
Triglycerides: 52 mg/dL (ref 0–149)
VLDL Cholesterol Cal: 8 mg/dL (ref 5–40)

## 2024-01-16 LAB — HEPATITIS B SURFACE ANTIBODY, QUANTITATIVE: Hepatitis B Surf Ab Quant: 21.2 m[IU]/mL

## 2024-01-16 LAB — TSH: TSH: 1.54 u[IU]/mL (ref 0.450–4.500)

## 2025-01-20 ENCOUNTER — Encounter: Admitting: Family Medicine
